# Patient Record
Sex: Female | Born: 1950
Health system: Southern US, Community
[De-identification: ages and names within clinical notes are randomized; demographics above are authoritative.]

## PROBLEM LIST (undated history)

## (undated) DIAGNOSIS — Z720 Tobacco use: Secondary | ICD-10-CM

## (undated) DIAGNOSIS — J42 Unspecified chronic bronchitis: Secondary | ICD-10-CM

## (undated) DIAGNOSIS — F329 Major depressive disorder, single episode, unspecified: Secondary | ICD-10-CM

## (undated) DIAGNOSIS — B1920 Unspecified viral hepatitis C without hepatic coma: Secondary | ICD-10-CM

## (undated) DIAGNOSIS — F1410 Cocaine abuse, uncomplicated: Secondary | ICD-10-CM

## (undated) DIAGNOSIS — R9431 Abnormal electrocardiogram [ECG] [EKG]: Secondary | ICD-10-CM

## (undated) DIAGNOSIS — J189 Pneumonia, unspecified organism: Secondary | ICD-10-CM

## (undated) DIAGNOSIS — E7800 Pure hypercholesterolemia, unspecified: Secondary | ICD-10-CM

## (undated) DIAGNOSIS — I10 Essential (primary) hypertension: Secondary | ICD-10-CM

## (undated) DIAGNOSIS — J8410 Pulmonary fibrosis, unspecified: Secondary | ICD-10-CM

## (undated) DIAGNOSIS — M069 Rheumatoid arthritis, unspecified: Secondary | ICD-10-CM

## (undated) DIAGNOSIS — F419 Anxiety disorder, unspecified: Secondary | ICD-10-CM

## (undated) DIAGNOSIS — K219 Gastro-esophageal reflux disease without esophagitis: Secondary | ICD-10-CM

## (undated) DIAGNOSIS — E119 Type 2 diabetes mellitus without complications: Secondary | ICD-10-CM

## (undated) DIAGNOSIS — R0600 Dyspnea, unspecified: Secondary | ICD-10-CM

## (undated) DIAGNOSIS — I739 Peripheral vascular disease, unspecified: Secondary | ICD-10-CM

## (undated) DIAGNOSIS — I509 Heart failure, unspecified: Secondary | ICD-10-CM

## (undated) DIAGNOSIS — F32A Depression, unspecified: Secondary | ICD-10-CM

## (undated) DIAGNOSIS — R0609 Other forms of dyspnea: Secondary | ICD-10-CM

## (undated) HISTORY — DX: Essential (primary) hypertension: I10

## (undated) HISTORY — DX: Abnormal electrocardiogram (ECG) (EKG): R94.31

## (undated) HISTORY — PX: DILATION AND CURETTAGE OF UTERUS: SHX78

## (undated) HISTORY — PX: TONSILLECTOMY: SUR1361

## (undated) HISTORY — DX: Depression, unspecified: F32.A

## (undated) HISTORY — DX: Major depressive disorder, single episode, unspecified: F32.9

## (undated) HISTORY — DX: Other forms of dyspnea: R06.09

## (undated) HISTORY — DX: Gastro-esophageal reflux disease without esophagitis: K21.9

## (undated) HISTORY — DX: Unspecified viral hepatitis C without hepatic coma: B19.20

## (undated) HISTORY — PX: TUBAL LIGATION: SHX77

## (undated) HISTORY — DX: Tobacco use: Z72.0

## (undated) HISTORY — DX: Dyspnea, unspecified: R06.00

---

## 1976-11-28 HISTORY — PX: OTHER SURGICAL HISTORY: SHX169

## 1980-11-28 HISTORY — PX: TOTAL ABDOMINAL HYSTERECTOMY: SHX209

## 1998-05-26 ENCOUNTER — Encounter: Admission: RE | Admit: 1998-05-26 | Discharge: 1998-05-26 | Payer: Self-pay | Admitting: Hematology and Oncology

## 2000-03-06 ENCOUNTER — Encounter: Admission: RE | Admit: 2000-03-06 | Discharge: 2000-03-06 | Payer: Self-pay | Admitting: Internal Medicine

## 2000-10-26 ENCOUNTER — Encounter: Admission: RE | Admit: 2000-10-26 | Discharge: 2000-10-26 | Payer: Self-pay | Admitting: Hematology and Oncology

## 2000-10-30 ENCOUNTER — Encounter: Admission: RE | Admit: 2000-10-30 | Discharge: 2000-10-30 | Payer: Self-pay | Admitting: Internal Medicine

## 2000-11-06 ENCOUNTER — Encounter: Admission: RE | Admit: 2000-11-06 | Discharge: 2000-11-06 | Payer: Self-pay | Admitting: Internal Medicine

## 2000-12-27 ENCOUNTER — Encounter: Admission: RE | Admit: 2000-12-27 | Discharge: 2000-12-27 | Payer: Self-pay | Admitting: Hematology and Oncology

## 2001-05-30 ENCOUNTER — Encounter: Admission: RE | Admit: 2001-05-30 | Discharge: 2001-05-30 | Payer: Self-pay | Admitting: Internal Medicine

## 2001-06-21 ENCOUNTER — Encounter: Admission: RE | Admit: 2001-06-21 | Discharge: 2001-06-21 | Payer: Self-pay | Admitting: Internal Medicine

## 2001-07-20 ENCOUNTER — Encounter: Admission: RE | Admit: 2001-07-20 | Discharge: 2001-07-20 | Payer: Self-pay | Admitting: Internal Medicine

## 2001-09-03 ENCOUNTER — Encounter: Admission: RE | Admit: 2001-09-03 | Discharge: 2001-09-03 | Payer: Self-pay

## 2001-09-06 ENCOUNTER — Ambulatory Visit (HOSPITAL_COMMUNITY): Admission: RE | Admit: 2001-09-06 | Discharge: 2001-09-06 | Payer: Self-pay | Admitting: *Deleted

## 2001-10-22 ENCOUNTER — Encounter: Admission: RE | Admit: 2001-10-22 | Discharge: 2001-10-22 | Payer: Self-pay | Admitting: Internal Medicine

## 2005-01-13 ENCOUNTER — Emergency Department (HOSPITAL_COMMUNITY): Admission: EM | Admit: 2005-01-13 | Discharge: 2005-01-13 | Payer: Self-pay | Admitting: *Deleted

## 2005-09-27 ENCOUNTER — Emergency Department (HOSPITAL_COMMUNITY): Admission: EM | Admit: 2005-09-27 | Discharge: 2005-09-27 | Payer: Self-pay | Admitting: Emergency Medicine

## 2006-05-22 ENCOUNTER — Ambulatory Visit: Payer: Self-pay | Admitting: Internal Medicine

## 2006-05-25 ENCOUNTER — Ambulatory Visit (HOSPITAL_COMMUNITY): Admission: RE | Admit: 2006-05-25 | Discharge: 2006-05-25 | Payer: Self-pay | Admitting: Family Medicine

## 2006-06-05 ENCOUNTER — Ambulatory Visit: Payer: Self-pay | Admitting: Internal Medicine

## 2007-05-22 ENCOUNTER — Ambulatory Visit: Payer: Self-pay | Admitting: Internal Medicine

## 2009-12-08 ENCOUNTER — Ambulatory Visit: Payer: Self-pay | Admitting: Diagnostic Radiology

## 2009-12-08 ENCOUNTER — Emergency Department (HOSPITAL_BASED_OUTPATIENT_CLINIC_OR_DEPARTMENT_OTHER): Admission: EM | Admit: 2009-12-08 | Discharge: 2009-12-08 | Payer: Self-pay | Admitting: Emergency Medicine

## 2010-12-19 ENCOUNTER — Encounter: Payer: Self-pay | Admitting: Internal Medicine

## 2010-12-20 ENCOUNTER — Encounter: Payer: Self-pay | Admitting: Internal Medicine

## 2011-02-13 LAB — BASIC METABOLIC PANEL
BUN: 5 mg/dL — ABNORMAL LOW (ref 6–23)
CO2: 31 mEq/L (ref 19–32)
Calcium: 9.7 mg/dL (ref 8.4–10.5)
Chloride: 103 mEq/L (ref 96–112)
Creatinine, Ser: 0.9 mg/dL (ref 0.4–1.2)
GFR calc Af Amer: >60 mL/min (ref >60)
GFR calc non Af Amer: >60 mL/min (ref >60)
Glucose, Bld: 163 mg/dL — ABNORMAL HIGH (ref 70–99)
Potassium: 4 mEq/L (ref 3.5–5.1)
Sodium: 143 mEq/L (ref 135–145)

## 2011-02-13 LAB — URINE MICROSCOPIC-ADD ON

## 2011-02-13 LAB — URINALYSIS, ROUTINE W REFLEX MICROSCOPIC
Glucose, UA: NEGATIVE mg/dL
Hgb urine dipstick: NEGATIVE
Ketones, ur: 15 mg/dL — AB
Nitrite: NEGATIVE
Protein, ur: 30 mg/dL — AB
Specific Gravity, Urine: 1.022 (ref 1.005–1.030)
Urobilinogen, UA: 1 mg/dL (ref 0.0–1.0)
pH: 6 (ref 5.0–8.0)

## 2011-02-13 LAB — DIFFERENTIAL
Basophils Absolute: 0 10*3/uL (ref 0.0–0.1)
Basophils Relative: 0 % (ref 0–1)
Eosinophils Absolute: 0 10*3/uL (ref 0.0–0.7)
Eosinophils Relative: 1 % (ref 0–5)
Lymphocytes Relative: 49 % — ABNORMAL HIGH (ref 12–46)
Lymphs Abs: 2.7 10*3/uL (ref 0.7–4.0)
Monocytes Absolute: 0.5 10*3/uL (ref 0.1–1.0)
Monocytes Relative: 10 % (ref 3–12)
Neutro Abs: 2.1 10*3/uL (ref 1.7–7.7)
Neutrophils Relative %: 40 % — ABNORMAL LOW (ref 43–77)

## 2011-02-13 LAB — CBC
HCT: 43.8 % (ref 36.0–46.0)
Hemoglobin: 14.7 g/dL (ref 12.0–15.0)
MCHC: 33.6 g/dL (ref 30.0–36.0)
MCV: 81.2 fL (ref 78.0–100.0)
Platelets: 272 10*3/uL (ref 150–400)
RBC: 5.4 MIL/uL — ABNORMAL HIGH (ref 3.87–5.11)
RDW: 13.5 % (ref 11.5–15.5)
WBC: 5.3 10*3/uL (ref 4.0–10.5)

## 2011-02-13 LAB — URINE CULTURE: Colony Count: 7000

## 2011-07-25 ENCOUNTER — Other Ambulatory Visit: Payer: Self-pay | Admitting: Internal Medicine

## 2011-07-25 DIAGNOSIS — Z1231 Encounter for screening mammogram for malignant neoplasm of breast: Secondary | ICD-10-CM

## 2011-08-05 ENCOUNTER — Ambulatory Visit
Admission: RE | Admit: 2011-08-05 | Discharge: 2011-08-05 | Disposition: A | Discharge status: Home / Self Care | Payer: Medicare Other | Source: Ambulatory Visit | Attending: Internal Medicine | Admitting: Internal Medicine

## 2011-08-05 DIAGNOSIS — Z1231 Encounter for screening mammogram for malignant neoplasm of breast: Secondary | ICD-10-CM

## 2011-08-10 ENCOUNTER — Other Ambulatory Visit: Payer: Self-pay | Admitting: Internal Medicine

## 2011-08-10 DIAGNOSIS — R928 Other abnormal and inconclusive findings on diagnostic imaging of breast: Secondary | ICD-10-CM

## 2011-08-15 ENCOUNTER — Ambulatory Visit
Admission: RE | Admit: 2011-08-15 | Discharge: 2011-08-15 | Disposition: A | Discharge status: Home / Self Care | Payer: Medicare Other | Source: Ambulatory Visit | Attending: Internal Medicine | Admitting: Internal Medicine

## 2011-08-15 ENCOUNTER — Other Ambulatory Visit: Payer: Self-pay | Admitting: Internal Medicine

## 2011-08-15 DIAGNOSIS — R928 Other abnormal and inconclusive findings on diagnostic imaging of breast: Secondary | ICD-10-CM

## 2012-01-23 ENCOUNTER — Encounter: Payer: Self-pay | Admitting: Pulmonary Disease

## 2012-01-24 ENCOUNTER — Ambulatory Visit (INDEPENDENT_AMBULATORY_CARE_PROVIDER_SITE_OTHER): Payer: Medicare Other | Admitting: Pulmonary Disease

## 2012-01-24 ENCOUNTER — Encounter: Payer: Self-pay | Admitting: Pulmonary Disease

## 2012-01-24 VITALS — BP 120/86 | HR 91 | Temp 98.0°F | Ht 62.0 in | Wt 131.6 lb

## 2012-01-24 DIAGNOSIS — R0989 Other specified symptoms and signs involving the circulatory and respiratory systems: Secondary | ICD-10-CM
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
## 2012-01-24 NOTE — Patient Instructions (Signed)
Will set up for breathing studies, and see you back same day to review Please bring disk of your recent ct chest with you to next visit. I will send a note to your primary doctor recommending a GI evaluation of your esophagus.  I am concerned you may be aspirating at night down into your lungs.   Work on total smoking cessation.

## 2012-02-03 ENCOUNTER — Ambulatory Visit (INDEPENDENT_AMBULATORY_CARE_PROVIDER_SITE_OTHER): Payer: Medicare Other | Admitting: Pulmonary Disease

## 2012-02-03 ENCOUNTER — Encounter: Payer: Self-pay | Admitting: Pulmonary Disease

## 2012-02-03 VITALS — BP 160/76 | HR 101 | Temp 98.2°F | Ht 62.0 in | Wt 133.0 lb

## 2012-02-03 DIAGNOSIS — R0989 Other specified symptoms and signs involving the circulatory and respiratory systems: Secondary | ICD-10-CM

## 2012-02-03 DIAGNOSIS — R0609 Other forms of dyspnea: Secondary | ICD-10-CM

## 2012-02-03 DIAGNOSIS — J9859 Other diseases of mediastinum, not elsewhere classified: Secondary | ICD-10-CM

## 2012-02-03 LAB — PULMONARY FUNCTION TEST

## 2012-02-03 NOTE — Progress Notes (Signed)
  Subjective:    Patient ID: Alexandria Coffey, female    DOB: 1951-07-28, 61 y.o.   MRN: [id number]  HPI The patient comes in today for followup after her pulmonary function studies.  She was found to have no air flow obstruction, moderate restriction, and a severe decrease in diffusion capacity.  Her DLCO corrected to normal with alveolar volume adjustment, and she did have very mild air trapping on lung volumes.  I have also had a chance to review her CT chest which shows significant interstitial disease in the right lung, but not in the left lung.  There were significant esophageal abnormalities noted as described in the CT report.  Finally, there was an ill-defined density in the anterior mediastinum that was very small and indistinct.  I have reviewed all of this with the patient in detail, and answered all of her questions.   Review of Systems  Constitutional: Negative for fever and unexpected weight change.  HENT: Negative for ear pain, nosebleeds, congestion, sore throat, rhinorrhea, sneezing, trouble swallowing, dental problem, postnasal drip and sinus pressure.   Eyes: Negative for redness and itching.  Respiratory: Negative for cough, chest tightness, shortness of breath and wheezing.   Cardiovascular: Negative for palpitations and leg swelling.  Gastrointestinal: Positive for nausea. Negative for vomiting.  Genitourinary: Negative for dysuria.  Musculoskeletal: Negative for joint swelling.  Skin: Negative for rash.  Neurological: Negative for headaches.  Hematological: Does not bruise/bleed easily.  Psychiatric/Behavioral: Negative for dysphoric mood. The patient is not nervous/anxious.        Objective:   Physical Exam Well-developed female in no acute distress Nose without purulence or discharge noted Chest with crackles throughout the right hemithorax, decreased breath sounds on the left Cardiac exam with regular rate and rhythm Lower extremities without significant edema, no  cyanosis Alert and oriented, moves all 4 extremities.       Assessment & Plan:
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
## 2012-02-03 NOTE — Patient Instructions (Signed)
Work on total smoking cessation Will schedule for sound wave test of heart to measure the pressure in the blood vessels in the lungs Please call your primary doctor's office Monday to see if they received my note recommending referral to a stomach specialist. You will need a repeat scan of your chest in 6mos to look at that "spot" in your chest.  Will call you to discuss test results, and arrange followup with me.

## 2012-02-03 NOTE — Assessment & Plan Note (Signed)
I have reviewed the patient's CT chest, and she clearly has significant interstitial changes on the right that are not present on the left.  This is unlikely to be related to an autoimmune process, and very likely to be secondary to chronic aspiration given her esophageal abnormalities.  Again, I would highly recommend a GI consultation.  Her PFTs today showed no airflow obstruction, but she does have significant restriction as expected with her diaphragm injury and interstitial process on the right.  Her diffusion capacity is also severely reduced, but corrects to normal with alveolar volume adjustment.  With her large pulmonary arteries on her CT scan, will check echocardiogram to rule out pulmonary hypertension.

## 2012-02-03 NOTE — Progress Notes (Signed)
PFT done today. 

## 2012-02-07 ENCOUNTER — Telehealth: Payer: Self-pay | Admitting: *Deleted

## 2012-02-07 DIAGNOSIS — T887XXA Unspecified adverse effect of drug or medicament, initial encounter: Secondary | ICD-10-CM

## 2012-02-07 NOTE — Telephone Encounter (Signed)
Message copied by Salli Quarry on Tue Feb 07, 2012 11:41 AM ------      Message from: Humberto Seals      Created: Tue Feb 07, 2012  9:28 AM       i need this pt a bmp in the computer  for 08/03/2012 for labs before ct with contrast on 08/08/12 thanks

## 2012-02-07 NOTE — Telephone Encounter (Signed)
Order in computer

## 2012-02-15 ENCOUNTER — Ambulatory Visit (HOSPITAL_COMMUNITY): Payer: Medicare Other | Attending: Cardiovascular Disease

## 2012-02-15 DIAGNOSIS — R0989 Other specified symptoms and signs involving the circulatory and respiratory systems: Secondary | ICD-10-CM | POA: Insufficient documentation

## 2012-02-15 DIAGNOSIS — E119 Type 2 diabetes mellitus without complications: Secondary | ICD-10-CM | POA: Insufficient documentation

## 2012-02-15 DIAGNOSIS — I10 Essential (primary) hypertension: Secondary | ICD-10-CM | POA: Insufficient documentation

## 2012-02-15 DIAGNOSIS — R0609 Other forms of dyspnea: Secondary | ICD-10-CM | POA: Insufficient documentation

## 2012-07-10 ENCOUNTER — Other Ambulatory Visit: Payer: Self-pay | Admitting: Internal Medicine

## 2012-07-10 DIAGNOSIS — Z1231 Encounter for screening mammogram for malignant neoplasm of breast: Secondary | ICD-10-CM

## 2012-08-08 ENCOUNTER — Other Ambulatory Visit (INDEPENDENT_AMBULATORY_CARE_PROVIDER_SITE_OTHER): Payer: Medicare Other

## 2012-08-08 ENCOUNTER — Other Ambulatory Visit: Payer: Medicare Other

## 2012-08-08 DIAGNOSIS — T887XXA Unspecified adverse effect of drug or medicament, initial encounter: Secondary | ICD-10-CM

## 2012-08-09 LAB — BASIC METABOLIC PANEL
BUN: 16 mg/dL (ref 6–23)
CO2: 27 mEq/L (ref 19–32)
Calcium: 9.3 mg/dL (ref 8.4–10.5)
Chloride: 104 mEq/L (ref 96–112)
Creatinine, Ser: 1 mg/dL (ref 0.4–1.2)
GFR: 76.9 mL/min (ref >60.00)
Glucose, Bld: 86 mg/dL (ref 70–99)
Potassium: 4.5 mEq/L (ref 3.5–5.1)
Sodium: 141 mEq/L (ref 135–145)

## 2012-08-10 ENCOUNTER — Ambulatory Visit (INDEPENDENT_AMBULATORY_CARE_PROVIDER_SITE_OTHER)
Admission: RE | Admit: 2012-08-10 | Discharge: 2012-08-10 | Disposition: A | Discharge status: Home / Self Care | Payer: Medicare Other | Source: Ambulatory Visit | Attending: Pulmonary Disease | Admitting: Pulmonary Disease

## 2012-08-10 DIAGNOSIS — R0989 Other specified symptoms and signs involving the circulatory and respiratory systems: Secondary | ICD-10-CM

## 2012-08-10 DIAGNOSIS — J9859 Other diseases of mediastinum, not elsewhere classified: Secondary | ICD-10-CM

## 2012-08-10 MED ORDER — IOHEXOL 300 MG/ML  SOLN
80.0000 mL | Freq: Once | INTRAMUSCULAR | Status: AC | PRN
  Administered 2012-08-10: 80 mL via INTRAVENOUS

## 2012-08-13 ENCOUNTER — Encounter: Payer: Self-pay | Admitting: Pulmonary Disease

## 2012-08-15 ENCOUNTER — Ambulatory Visit
Admission: RE | Admit: 2012-08-15 | Discharge: 2012-08-15 | Disposition: A | Discharge status: Home / Self Care | Payer: Medicare Other | Source: Ambulatory Visit | Attending: Internal Medicine | Admitting: Internal Medicine

## 2012-08-15 DIAGNOSIS — Z1231 Encounter for screening mammogram for malignant neoplasm of breast: Secondary | ICD-10-CM

## 2012-11-26 ENCOUNTER — Ambulatory Visit: Payer: Medicare Other | Admitting: Pulmonary Disease

## 2012-11-30 ENCOUNTER — Encounter: Payer: Self-pay | Admitting: Pulmonary Disease

## 2012-11-30 ENCOUNTER — Ambulatory Visit (INDEPENDENT_AMBULATORY_CARE_PROVIDER_SITE_OTHER): Payer: Medicare Other | Admitting: Pulmonary Disease

## 2012-11-30 VITALS — BP 150/92 | HR 88 | Temp 98.2°F | Ht 62.0 in | Wt 132.6 lb

## 2012-11-30 DIAGNOSIS — R0989 Other specified symptoms and signs involving the circulatory and respiratory systems: Secondary | ICD-10-CM
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
## 2012-11-30 NOTE — Progress Notes (Signed)
  Subjective:    Patient ID: Alexandria Coffey, female    DOB: March 14, 1951, 62 y.o.   MRN: [id number]  HPI Patient comes in today for followup of her known dyspnea on exertion, and also right-sided scarring and bronchiectasis probably related to aspiration.  She feels that her reflux is reasonably controlled, but does continue to have breakthrough at times.  I've asked her to stay in close contact with her gastroenterologist.  She also has minimal airflow obstruction on PFTs, and uses albuterol only as needed.  She denies any significant cough or mucus production.  She feels that her dyspnea on exertion currently is at baseline.   Review of Systems  Constitutional: Negative for fever and unexpected weight change.  HENT: Negative for ear pain, nosebleeds, congestion, sore throat, rhinorrhea, sneezing, trouble swallowing, dental problem, postnasal drip and sinus pressure.   Eyes: Negative for redness and itching.  Respiratory: Positive for cough ( dry cough at night), chest tightness, shortness of breath and wheezing.   Cardiovascular: Negative for palpitations and leg swelling.  Gastrointestinal: Negative for nausea and vomiting.  Genitourinary: Negative for dysuria.  Musculoskeletal: Negative for joint swelling.  Skin: Negative for rash.  Neurological: Negative for headaches.  Hematological: Does not bruise/bleed easily.  Psychiatric/Behavioral: Negative for dysphoric mood. The patient is not nervous/anxious.        Objective:   Physical Exam Well-developed female in no acute distress Nose without purulence or discharge noted Chest with crackles on the right, left clear Cardiac exam with regular rate and rhythm Lower extremities without edema, no cyanosis Alert and oriented, moves all 4 extremities.       Assessment & Plan:

## 2012-11-30 NOTE — Patient Instructions (Addendum)
Will give you the flu shot today. Ok to stay on albuterol just as needed. Make sure you keep close followup with your GI doctor, and to keep your reflux controlled. Work on Product manager followup with me as needed.

## 2013-05-09 ENCOUNTER — Other Ambulatory Visit: Payer: Self-pay | Admitting: Internal Medicine

## 2013-05-09 DIAGNOSIS — K219 Gastro-esophageal reflux disease without esophagitis: Secondary | ICD-10-CM

## 2013-05-22 ENCOUNTER — Other Ambulatory Visit: Payer: Self-pay | Admitting: Internal Medicine

## 2013-05-22 DIAGNOSIS — R1013 Epigastric pain: Secondary | ICD-10-CM

## 2013-05-27 ENCOUNTER — Ambulatory Visit
Admission: RE | Admit: 2013-05-27 | Discharge: 2013-05-27 | Disposition: A | Discharge status: Home / Self Care | Payer: Medicare Other | Source: Ambulatory Visit | Attending: Internal Medicine | Admitting: Internal Medicine

## 2013-05-27 DIAGNOSIS — R1013 Epigastric pain: Secondary | ICD-10-CM

## 2013-08-23 ENCOUNTER — Other Ambulatory Visit: Payer: Self-pay

## 2013-08-23 DIAGNOSIS — Z1231 Encounter for screening mammogram for malignant neoplasm of breast: Secondary | ICD-10-CM

## 2013-09-17 ENCOUNTER — Other Ambulatory Visit (HOSPITAL_COMMUNITY): Payer: Self-pay | Admitting: Internal Medicine

## 2013-09-17 ENCOUNTER — Ambulatory Visit (HOSPITAL_COMMUNITY)
Admission: RE | Admit: 2013-09-17 | Discharge: 2013-09-17 | Disposition: A | Discharge status: Home / Self Care | Payer: Medicare Other | Source: Ambulatory Visit | Attending: Internal Medicine | Admitting: Internal Medicine

## 2013-09-17 DIAGNOSIS — R059 Cough, unspecified: Secondary | ICD-10-CM | POA: Insufficient documentation

## 2013-09-17 DIAGNOSIS — J209 Acute bronchitis, unspecified: Secondary | ICD-10-CM

## 2013-09-17 DIAGNOSIS — R05 Cough: Secondary | ICD-10-CM | POA: Insufficient documentation

## 2013-09-17 DIAGNOSIS — J8410 Pulmonary fibrosis, unspecified: Secondary | ICD-10-CM | POA: Insufficient documentation

## 2013-09-20 ENCOUNTER — Ambulatory Visit
Admission: RE | Admit: 2013-09-20 | Discharge: 2013-09-20 | Disposition: A | Discharge status: Home / Self Care | Payer: Medicare Other | Source: Ambulatory Visit

## 2013-09-20 DIAGNOSIS — Z1231 Encounter for screening mammogram for malignant neoplasm of breast: Secondary | ICD-10-CM

## 2014-06-11 ENCOUNTER — Other Ambulatory Visit (HOSPITAL_COMMUNITY): Payer: Self-pay | Admitting: Internal Medicine

## 2014-06-11 ENCOUNTER — Ambulatory Visit (HOSPITAL_COMMUNITY): Payer: Medicare Other | Attending: Internal Medicine

## 2014-06-11 DIAGNOSIS — I739 Peripheral vascular disease, unspecified: Secondary | ICD-10-CM

## 2014-07-23 ENCOUNTER — Ambulatory Visit (HOSPITAL_COMMUNITY)
Admission: RE | Admit: 2014-07-23 | Discharge: 2014-07-23 | Disposition: A | Discharge status: Home / Self Care | Payer: Medicare Other | Source: Ambulatory Visit | Attending: Internal Medicine | Admitting: Internal Medicine

## 2014-07-23 ENCOUNTER — Other Ambulatory Visit (HOSPITAL_COMMUNITY): Payer: Self-pay | Admitting: Internal Medicine

## 2014-07-23 DIAGNOSIS — I70219 Atherosclerosis of native arteries of extremities with intermittent claudication, unspecified extremity: Secondary | ICD-10-CM

## 2014-07-23 DIAGNOSIS — I739 Peripheral vascular disease, unspecified: Secondary | ICD-10-CM | POA: Insufficient documentation

## 2014-07-23 NOTE — Progress Notes (Signed)
VASCULAR LAB PRELIMINARY  ARTERIAL  ABI completed:    RIGHT    LEFT    PRESSURE WAVEFORM  PRESSURE WAVEFORM  BRACHIAL 179 Biphasic BRACHIAL 141 Monophasic  DP 175 Biphasic DP 177 Biphasic  AT   AT    PT 153 Biphasic PT 141 Biphasic  PER   PER    GREAT TOE  NA GREAT TOE  NA    RIGHT LEFT  ABI 0.98 0.99   Post exercise the right brachial pressure was , the right ankle pressure was and the left ankle pressure was .   At rest bilateral ABIs are within normal limits. 46 seconds after exercise the right ABI is within normal limits, 1 minute and 29 seconds after exercise the left ABI is suggestive of mild arterial insufficiency.   There is a difference between the right and left brachial pressures, suggestive of a proximal obstruction.  07/23/2014 12:44 PM Gertie Fey, RVT, RDCS, RDMS

## 2014-10-13 DIAGNOSIS — E1151 Type 2 diabetes mellitus with diabetic peripheral angiopathy without gangrene: Secondary | ICD-10-CM

## 2014-10-14 ENCOUNTER — Ambulatory Visit (HOSPITAL_COMMUNITY)
Admission: RE | Admit: 2014-10-14 | Discharge: 2014-10-14 | Disposition: A | Discharge status: Home / Self Care | Payer: Medicare Other | Source: Ambulatory Visit | Attending: Cardiology | Admitting: Cardiology

## 2014-10-14 ENCOUNTER — Encounter (HOSPITAL_COMMUNITY)
Admission: RE | Disposition: A | Discharge status: Home / Self Care | Payer: Self-pay | Source: Ambulatory Visit | Attending: Cardiology

## 2014-10-14 ENCOUNTER — Encounter (HOSPITAL_COMMUNITY): Payer: Self-pay | Admitting: Cardiology

## 2014-10-14 DIAGNOSIS — I739 Peripheral vascular disease, unspecified: Secondary | ICD-10-CM

## 2014-10-14 DIAGNOSIS — E1151 Type 2 diabetes mellitus with diabetic peripheral angiopathy without gangrene: Secondary | ICD-10-CM

## 2014-10-14 DIAGNOSIS — I70212 Atherosclerosis of native arteries of extremities with intermittent claudication, left leg: Secondary | ICD-10-CM | POA: Insufficient documentation

## 2014-10-14 HISTORY — DX: Peripheral vascular disease, unspecified: I73.9

## 2014-10-14 HISTORY — PX: LOWER EXTREMITY ANGIOGRAM: SHX5508

## 2014-10-14 LAB — GLUCOSE, CAPILLARY: GLUCOSE-CAPILLARY: 177 mg/dL — AB (ref 70–99)

## 2014-10-14 LAB — CBC
HEMATOCRIT: 34.4 % — AB (ref 36.0–46.0)
HEMOGLOBIN: 11.3 g/dL — AB (ref 12.0–15.0)
MCH: 23.1 pg — AB (ref 26.0–34.0)
MCHC: 32.8 g/dL (ref 30.0–36.0)
MCV: 70.3 fL — AB (ref 78.0–100.0)
PLATELETS: 277 10*3/uL (ref 150–400)
RBC: 4.89 MIL/uL (ref 3.87–5.11)
RDW: 16.2 % — ABNORMAL HIGH (ref 11.5–15.5)
WBC: 5 10*3/uL (ref 4.0–10.5)

## 2014-10-14 LAB — POCT ACTIVATED CLOTTING TIME
ACTIVATED CLOTTING TIME: 259 s
Activated Clotting Time: 259 seconds

## 2014-10-14 SURGERY — ANGIOGRAM, LOWER EXTREMITY
Anesthesia: LOCAL

## 2014-10-14 MED ORDER — HYDRALAZINE HCL 25 MG PO TABS: 25.0000 mg | ORAL_TABLET | Freq: Three times a day (TID) | ORAL | Status: DC

## 2014-10-14 MED ORDER — HYDROMORPHONE HCL 1 MG/ML IJ SOLN
INTRAMUSCULAR | Status: AC
  Filled 2014-10-14: qty 1

## 2014-10-14 MED ORDER — METFORMIN HCL 1000 MG PO TABS: 1000.0000 mg | ORAL_TABLET | Freq: Two times a day (BID) | ORAL | Status: DC

## 2014-10-14 MED ORDER — NITROGLYCERIN 1 MG/10 ML FOR IR/CATH LAB
INTRA_ARTERIAL | Status: AC
  Filled 2014-10-14: qty 10

## 2014-10-14 MED ORDER — HYDRALAZINE HCL 20 MG/ML IJ SOLN
10.0000 mg | Freq: Once | INTRAMUSCULAR | Status: AC
Start: 2014-10-14 — End: 2014-10-14
  Administered 2014-10-14: 10 mg via INTRAVENOUS

## 2014-10-14 MED ORDER — HYDRALAZINE HCL 20 MG/ML IJ SOLN
INTRAMUSCULAR | Status: AC
  Filled 2014-10-14: qty 1

## 2014-10-14 MED ORDER — HEPARIN (PORCINE) IN NACL 2-0.9 UNIT/ML-% IJ SOLN
INTRAMUSCULAR | Status: AC
  Filled 2014-10-14: qty 1000

## 2014-10-14 MED ORDER — SODIUM CHLORIDE 0.9 % IV SOLN
INTRAVENOUS | Status: DC
  Administered 2014-10-14: 08:00:00 via INTRAVENOUS

## 2014-10-14 MED ORDER — SODIUM CHLORIDE 0.9 % IV SOLN: 1.0000 mL/kg/h | INTRAVENOUS | Status: DC

## 2014-10-14 MED ORDER — LIDOCAINE HCL (PF) 1 % IJ SOLN
INTRAMUSCULAR | Status: AC
  Filled 2014-10-14: qty 30

## 2014-10-14 MED ORDER — MIDAZOLAM HCL 2 MG/2ML IJ SOLN
INTRAMUSCULAR | Status: AC
  Filled 2014-10-14: qty 2

## 2014-10-14 MED ORDER — HEPARIN SODIUM (PORCINE) 1000 UNIT/ML IJ SOLN
INTRAMUSCULAR | Status: AC
  Filled 2014-10-14: qty 1

## 2014-10-14 NOTE — H&P (Signed)
  Please see office visit notes for complete details of HPI.  

## 2014-10-14 NOTE — Interval H&P Note (Signed)
History and Physical Interval Note:  10/14/2014 9:49 AM  Alexandria Coffey  has presented today for surgery, with the diagnosis of PVD  The various methods of treatment have been discussed with the patient and family. After consideration of risks, benefits and other options for treatment, the patient has consented to  Procedure(s): LOWER EXTREMITY ANGIOGRAM (N/A) and possible PTA as a surgical intervention .  The patient's history has been reviewed, patient examined, no change in status, stable for surgery.  I have reviewed the patient's chart and labs.  Questions were answered to the patient's satisfaction.     Pamella Pert

## 2014-10-14 NOTE — Progress Notes (Signed)
Report received from [facility]; Dr Jacinto Halim notified of B/P and order noted

## 2014-10-14 NOTE — Discharge Instructions (Signed)
Angiogram, Care After °Refer to this sheet in the next few weeks. These instructions provide you with information on caring for yourself after your procedure. Your health care provider may also give you more specific instructions. Your treatment has been planned according to current medical practices, but problems sometimes occur. Call your health care provider if you have any problems or questions after your procedure.  °WHAT TO EXPECT AFTER THE PROCEDURE °After your procedure, it is typical to have the following sensations: °· Minor discomfort or tenderness and a small bump at the catheter insertion site. The bump should usually decrease in size and tenderness within 1 to 2 weeks. °· Any bruising will usually fade within 2 to 4 weeks. °HOME CARE INSTRUCTIONS  °· You may need to keep taking blood thinners if they were prescribed for you. Take medicines only as directed by your health care provider. °· Do not apply powder or lotion to the site. °· Do not take baths, swim, or use a hot tub until your health care provider approves. °· You may shower 24 hours after the procedure. Remove the bandage (dressing) and gently wash the site with plain soap and water. Gently pat the site dry. °· Inspect the site at least twice daily. °· Limit your activity for the first 48 hours. Do not bend, squat, or lift anything over 20 lb (9 kg) or as directed by your health care provider. °· Plan to have someone take you home after the procedure. Follow instructions about when you can drive or return to work. °SEEK MEDICAL CARE IF: °· You get light-headed when standing up. °· You have drainage (other than a small amount of blood on the dressing). °· You have chills. °· You have a fever. °· You have redness, warmth, swelling, or pain at the insertion site. °SEEK IMMEDIATE MEDICAL CARE IF:  °· You develop chest pain or shortness of breath, feel faint, or pass out. °· You have bleeding, swelling larger than a walnut, or drainage from the  catheter insertion site. °· You develop pain, discoloration, coldness, or severe bruising in the leg or arm that held the catheter. °· You have heavy bleeding from the site. If this happens, hold pressure on the site and call 911. °MAKE SURE YOU: °· Understand these instructions. °· Will watch your condition. °· Will get help right away if you are not doing well or get worse. °Document Released: 06/02/2005 Document Revised: 03/31/2014 Document Reviewed: 04/08/2013 °[employer]® Patient Information ©2015 [employer]. This information is not intended to replace advice given to you by your health care provider. Make sure you discuss any questions you have with your health care provider. ° °

## 2014-10-14 NOTE — Op Note (Signed)
Procedures performed:  1. Right Femoral Arterial access 2. Abdominal aortogram. Abdominal aortogram with bifemoral runoff 3. Crossover from right to the left  femoral artery placement of catheter tip in the left  femoral artery.  4. left femoral arteriogram with distal runoff 5. PTA balloon angioplasty with a 6.0 x 40 mm chocolate balloon of the left proximal popliteal, mid SFA, proximal SFA, distal left external iliac artery 6. right femoral arteriogram and closure of the  femoral arterial access with  Perclose.  Indication: Claudication, lifestyle limiting,  Abnormal LE arterial duplex.   Peripheral arthrogram: No evidence of abdominal aneurysm. 2 renal arteries one on either side and they're widely patent.   Aortoiliac bifurcation was widely patent.left external iliac artery just before it crosses the humeral ligament, had a greater than 50% stenosis.  Femoral arteriogram: right femoral artery with distal runoff revealed a high-grade focal stenosis in the mid right SFA, followed by tandem less than 50% stenosis. Below the right knee, there is three-vessel runoff. The anterior tibial artery is a focal high-grade 90% stenosis.  Left femoral artery with distal runoff revealed left external iliac artery stenosis of greater than 50%, followed by a high-grade stenosis of 80%, tandem lesions in the proximal SFA just after its origin. The left SFA also had a high-grade 90% stenosis in the midsegment and distal SFA 90% stenosis followed by proximal popliteal artery of 90% stenosis. Below the left knee, there was two-vessel runoff in the form of peroneal and posterior tibial artery.  Interventional data: Sccessful PTA balloon angioplasty with a 6.0 x 40 mm chocolate balloon of the left proximal popliteal, mid SFA, proximal SFA, distal left external iliac artery. Brisk runoff was evident throughout the left lower extremity all the way to the below the knee vessels midway. Vessel runoff at the ankle was not  visualized after PTA.  Recommendation: Patient has a high-grade 90% stenosis in the right mid SFA which is very focal and is amenable for percutaneous angioplasty, the right anterior tibial artery has a high-grade 90-95% stenosis. I suspect this artery will probably completely close similar to the closure on the left anterior tibial artery. Hence I may consider balloon angioplasty of the same while performing right SFA angioplasty, however procedure will be determined depending on symptoms of the patient'.  TECHNIQUE OF THE  PROCEDURE: Under sterile precautions using a 5-French right femoral arterial access, a 5 French pigtail catheter was advanced  into the desscending aorta and arteriogram was performed. Then abdominal aortogram with bifemoral runoff was also performed.  Femoral arterial runoff: left femoral arterial runoff was performed using the same catheter after crossing over from the right  femoral artery into the left femoral artery with the help of a pigtail catheter. I then utilized a 5 [place] KMP catheter to guide me to access the femoral artery and SFA. The 5 French sheath on the right femoral artery was exchanged to a 45 cm 7 [place] Ansel sheath. The sheath was placed over the Versacore wire into the left common iliac artery. This was followed by intravenous heparin and maintaining ACT greater than 200 and then I utilized a 0.014" x 290 cm [place] core guidewire and the tip of the guidewire was placed into the peroneal artery. I then utilized the chocolate balloon and performed multiple angioplasty throughout the lesion sites, multiple inflations from 6 atmospheric pressures was performed for 1 minute to maximum of 2 minutes. The left external iliac artery stenosis was angioplastied at 10 atmospheric pressure for 90  seconds. Intra-arterial nitroglycerin was administered and angiography was performed. After visualization in multiple views, making sure there was no flow-limiting dissection, the wire  was withdrawn, the 7 [place] Ansel sheath was then pulled out of the body after performing right femoral arteriogram and Perclose device was utilized to close the access. Excellent hemostasis was obtained. Patient tolerated the procedure well. There was no immediate competitions. Patient will be discharged home with outpatient follow-up. A total of 170-180 mL of contrast was utilized for diagnostic and interventional procedure.

## 2014-11-06 ENCOUNTER — Encounter (HOSPITAL_COMMUNITY): Payer: Self-pay | Admitting: Cardiology

## 2014-11-29 ENCOUNTER — Emergency Department (HOSPITAL_BASED_OUTPATIENT_CLINIC_OR_DEPARTMENT_OTHER)
Admission: EM | Admit: 2014-11-29 | Discharge: 2014-11-29 | Disposition: A | Discharge status: Home / Self Care | Payer: Medicare Other | Attending: Emergency Medicine | Admitting: Emergency Medicine

## 2014-11-29 ENCOUNTER — Encounter (HOSPITAL_BASED_OUTPATIENT_CLINIC_OR_DEPARTMENT_OTHER): Payer: Self-pay | Admitting: *Deleted

## 2014-11-29 DIAGNOSIS — Z8739 Personal history of other diseases of the musculoskeletal system and connective tissue: Secondary | ICD-10-CM | POA: Insufficient documentation

## 2014-11-29 DIAGNOSIS — M542 Cervicalgia: Secondary | ICD-10-CM | POA: Diagnosis present

## 2014-11-29 DIAGNOSIS — Z7982 Long term (current) use of aspirin: Secondary | ICD-10-CM | POA: Diagnosis not present

## 2014-11-29 DIAGNOSIS — K219 Gastro-esophageal reflux disease without esophagitis: Secondary | ICD-10-CM | POA: Diagnosis not present

## 2014-11-29 DIAGNOSIS — S161XXA Strain of muscle, fascia and tendon at neck level, initial encounter: Secondary | ICD-10-CM

## 2014-11-29 DIAGNOSIS — E780 Pure hypercholesterolemia: Secondary | ICD-10-CM | POA: Insufficient documentation

## 2014-11-29 DIAGNOSIS — Y998 Other external cause status: Secondary | ICD-10-CM | POA: Diagnosis not present

## 2014-11-29 DIAGNOSIS — X58XXXA Exposure to other specified factors, initial encounter: Secondary | ICD-10-CM | POA: Diagnosis not present

## 2014-11-29 DIAGNOSIS — F329 Major depressive disorder, single episode, unspecified: Secondary | ICD-10-CM | POA: Insufficient documentation

## 2014-11-29 DIAGNOSIS — Z79899 Other long term (current) drug therapy: Secondary | ICD-10-CM | POA: Diagnosis not present

## 2014-11-29 DIAGNOSIS — Y9389 Activity, other specified: Secondary | ICD-10-CM | POA: Diagnosis not present

## 2014-11-29 DIAGNOSIS — I10 Essential (primary) hypertension: Secondary | ICD-10-CM | POA: Diagnosis not present

## 2014-11-29 DIAGNOSIS — Z720 Tobacco use: Secondary | ICD-10-CM | POA: Insufficient documentation

## 2014-11-29 DIAGNOSIS — E119 Type 2 diabetes mellitus without complications: Secondary | ICD-10-CM | POA: Insufficient documentation

## 2014-11-29 DIAGNOSIS — Y929 Unspecified place or not applicable: Secondary | ICD-10-CM | POA: Insufficient documentation

## 2014-11-29 DIAGNOSIS — R079 Chest pain, unspecified: Secondary | ICD-10-CM | POA: Diagnosis not present

## 2014-11-29 DIAGNOSIS — Z8619 Personal history of other infectious and parasitic diseases: Secondary | ICD-10-CM | POA: Diagnosis not present

## 2014-11-29 HISTORY — DX: Pure hypercholesterolemia, unspecified: E78.00

## 2014-11-29 LAB — TROPONIN I: Troponin I: <0.03 ng/mL (ref <0.031)

## 2014-11-29 MED ORDER — CYCLOBENZAPRINE HCL 10 MG PO TABS: 10.0000 mg | ORAL_TABLET | Freq: Three times a day (TID) | ORAL | Status: DC | PRN

## 2014-11-29 MED ORDER — HYDROCODONE-ACETAMINOPHEN 5-325 MG PO TABS: 2.0000 | ORAL_TABLET | Freq: Once | ORAL | Status: DC

## 2014-11-29 MED ORDER — OXYCODONE HCL 5 MG PO TABS: 5.0000 mg | ORAL_TABLET | Freq: Four times a day (QID) | ORAL | Status: DC | PRN

## 2014-11-29 MED ORDER — CYCLOBENZAPRINE HCL 10 MG PO TABS
10.0000 mg | ORAL_TABLET | Freq: Once | ORAL | Status: AC
  Administered 2014-11-29: 10 mg via ORAL
  Filled 2014-11-29: qty 1

## 2014-11-29 MED ORDER — OXYCODONE-ACETAMINOPHEN 5-325 MG PO TABS
2.0000 | ORAL_TABLET | Freq: Once | ORAL | Status: AC
  Administered 2014-11-29: 2 via ORAL
  Filled 2014-11-29: qty 2
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
## 2014-11-29 NOTE — ED Notes (Signed)
Patient c/o R side neck and R shoulder pain for the past few days,, she states that it feels as if she has a lump that is tender. Earlier in the week her L shoulder hurt also. No meds taken, no injury

## 2014-11-29 NOTE — Discharge Instructions (Signed)
Cervical Sprain A cervical sprain is an injury in the neck in which the strong, fibrous tissues (ligaments) that connect your neck bones stretch or tear. Cervical sprains can range from mild to severe. Severe cervical sprains can cause the neck vertebrae to be unstable. This can lead to damage of the spinal cord and can result in serious nervous system problems. The amount of time it takes for a cervical sprain to get better depends on the cause and extent of the injury. Most cervical sprains heal in 1 to 3 weeks. CAUSES  Severe cervical sprains may be caused by:   Contact sport injuries (such as from football, rugby, wrestling, hockey, auto racing, gymnastics, diving, martial arts, or boxing).   Motor vehicle collisions.   Whiplash injuries. This is an injury from a sudden forward and backward whipping movement of the head and neck.  Falls.  Mild cervical sprains may be caused by:   Being in an awkward position, such as while cradling a telephone between your ear and shoulder.   Sitting in a chair that does not offer proper support.   Working at a poorly Landscape [employer] station.   Looking up or down for long periods of time.  SYMPTOMS   Pain, soreness, stiffness, or a burning sensation in the front, back, or sides of the neck. This discomfort may develop immediately after the injury or slowly, 24 hours or more after the injury.   Pain or tenderness directly in the middle of the back of the neck.   Shoulder or upper back pain.   Limited ability to move the neck.   Headache.   Dizziness.   Weakness, numbness, or tingling in the hands or arms.   Muscle spasms.   Difficulty swallowing or chewing.   Tenderness and swelling of the neck.  DIAGNOSIS  Most of the time your health care provider can diagnose a cervical sprain by taking your history and doing a physical exam. Your health care provider will ask about previous neck injuries and any known neck  problems, such as arthritis in the neck. X-rays may be taken to find out if there are any other problems, such as with the bones of the neck. Other tests, such as a CT scan or MRI, may also be needed.  TREATMENT  Treatment depends on the severity of the cervical sprain. Mild sprains can be treated with rest, keeping the neck in place (immobilization), and pain medicines. Severe cervical sprains are immediately immobilized. Further treatment is done to help with pain, muscle spasms, and other symptoms and may include:  Medicines, such as pain relievers, numbing medicines, or muscle relaxants.   Physical therapy. This may involve stretching exercises, strengthening exercises, and posture training. Exercises and improved posture can help stabilize the neck, strengthen muscles, and help stop symptoms from returning.  HOME CARE INSTRUCTIONS   Put ice on the injured area.   Put ice in a plastic bag.   Place a towel between your skin and the bag.   Leave the ice on for 15-20 minutes, 3-4 times a day.   If your injury was severe, you may have been given a cervical collar to wear. A cervical collar is a two-piece collar designed to keep your neck from moving while it heals.  Do not remove the collar unless instructed by your health care provider.  If you have long hair, keep it outside of the collar.  Ask your health care provider before making any adjustments to your collar. Minor  adjustments may be required over time to improve comfort and reduce pressure on your chin or on the back of your head.  Ifyou are allowed to remove the collar for cleaning or bathing, follow your health care provider's instructions on how to do so safely.  Keep your collar clean by wiping it with mild soap and water and drying it completely. If the collar you have been given includes removable pads, remove them every 1-2 days and hand wash them with soap and water. Allow them to air dry. They should be completely  dry before you wear them in the collar.  If you are allowed to remove the collar for cleaning and bathing, wash and dry the skin of your neck. Check your skin for irritation or sores. If you see any, tell your health care provider.  Do not drive while wearing the collar.   Only take over-the-counter or prescription medicines for pain, discomfort, or fever as directed by your health care provider.   Keep all follow-up appointments as directed by your health care provider.   Keep all physical therapy appointments as directed by your health care provider.   Make any needed adjustments to your workstation to promote good posture.   Avoid positions and activities that make your symptoms worse.   Warm up and stretch before being active to help prevent problems.  SEEK MEDICAL CARE IF:   Your pain is not controlled with medicine.   You are unable to decrease your pain medicine over time as planned.   Your activity level is not improving as expected.  SEEK IMMEDIATE MEDICAL CARE IF:   You develop any bleeding.  You develop stomach upset.  You have signs of an allergic reaction to your medicine.   Your symptoms get worse.   You develop new, unexplained symptoms.   You have numbness, tingling, weakness, or paralysis in any part of your body.  MAKE SURE YOU:   Understand these instructions.  Will watch your condition.  Will get help right away if you are not doing well or get worse. Document Released: 09/11/2007 Document Revised: 11/19/2013 Document Reviewed: 05/22/2013 [facility] Patient Information 2015 [place], [place]. This information is not intended to replace advice given to you by your health care provider. Make sure you discuss any questions you have with your health care provider.    Muscle Cramps and Spasms Muscle cramps and spasms occur when a muscle or muscles tighten and you have no control over this tightening (involuntary muscle contraction). They are  a common problem and can develop in any muscle. The most common place is in the calf muscles of the leg. Both muscle cramps and muscle spasms are involuntary muscle contractions, but they also have differences:   Muscle cramps are sporadic and painful. They may last a few seconds to a quarter of an hour. Muscle cramps are often more forceful and last longer than muscle spasms.  Muscle spasms may or may not be painful. They may also last just a few seconds or much longer. CAUSES  It is uncommon for cramps or spasms to be due to a serious underlying problem. In many cases, the cause of cramps or spasms is unknown. Some common causes are:   Overexertion.   Overuse from repetitive motions (doing the same thing over and over).   Remaining in a certain position for a long period of time.   Improper preparation, form, or technique while performing a sport or activity.   Dehydration.   Injury.  Side effects of some medicines.   Abnormally low levels of the salts and ions in your blood (electrolytes), especially potassium and calcium. This could happen if you are taking water pills (diuretics) or you are pregnant.  Some underlying medical problems can make it more likely to develop cramps or spasms. These include, but are not limited to:   Diabetes.   Parkinson disease.   Hormone disorders, such as thyroid problems.   Alcohol abuse.   Diseases specific to muscles, joints, and bones.   Blood vessel disease where not enough blood is getting to the muscles.  HOME CARE INSTRUCTIONS   Stay well hydrated. Drink enough water and fluids to keep your urine clear or pale yellow.  It may be helpful to massage, stretch, and relax the affected muscle.  For tight or tense muscles, use a warm towel, heating pad, or hot shower water directed to the affected area.  If you are sore or have pain after a cramp or spasm, applying ice to the affected area may relieve discomfort.  Put  ice in a plastic bag.  Place a towel between your skin and the bag.  Leave the ice on for 15-20 minutes, 03-04 times a day.  Medicines used to treat a known cause of cramps or spasms may help reduce their frequency or severity. Only take over-the-counter or prescription medicines as directed by your caregiver. SEEK MEDICAL CARE IF:  Your cramps or spasms get more severe, more frequent, or do not improve over time.  MAKE SURE YOU:   Understand these instructions.  Will watch your condition.  Will get help right away if you are not doing well or get worse. Document Released: 05/06/2002 Document Revised: 03/11/2013 Document Reviewed: 10/31/2012 [facility] Patient Information 2015 [place], [place]. This information is not intended to replace advice given to you by your health care provider. Make sure you discuss any questions you have with your health care provider.

## 2014-12-09 ENCOUNTER — Other Ambulatory Visit: Payer: Self-pay

## 2014-12-09 DIAGNOSIS — Z1231 Encounter for screening mammogram for malignant neoplasm of breast: Secondary | ICD-10-CM

## 2014-12-23 ENCOUNTER — Ambulatory Visit: Payer: Medicare Other

## 2014-12-30 ENCOUNTER — Ambulatory Visit
Admission: RE | Admit: 2014-12-30 | Discharge: 2014-12-30 | Disposition: A | Discharge status: Home / Self Care | Payer: Medicare Other | Source: Ambulatory Visit

## 2014-12-30 ENCOUNTER — Encounter (INDEPENDENT_AMBULATORY_CARE_PROVIDER_SITE_OTHER): Payer: Self-pay

## 2014-12-30 ENCOUNTER — Ambulatory Visit: Payer: Medicare Other

## 2014-12-30 DIAGNOSIS — Z1231 Encounter for screening mammogram for malignant neoplasm of breast: Secondary | ICD-10-CM

## 2015-02-17 ENCOUNTER — Other Ambulatory Visit (HOSPITAL_COMMUNITY): Payer: Self-pay | Admitting: Internal Medicine

## 2015-02-17 DIAGNOSIS — I739 Peripheral vascular disease, unspecified: Secondary | ICD-10-CM

## 2015-02-24 ENCOUNTER — Ambulatory Visit (HOSPITAL_COMMUNITY)
Admission: RE | Admit: 2015-02-24 | Discharge: 2015-02-24 | Disposition: A | Discharge status: Home / Self Care | Payer: Medicare Other | Source: Ambulatory Visit | Attending: Internal Medicine | Admitting: Internal Medicine

## 2015-02-24 ENCOUNTER — Other Ambulatory Visit (HOSPITAL_COMMUNITY): Payer: Self-pay | Admitting: Internal Medicine

## 2015-02-24 DIAGNOSIS — M25512 Pain in left shoulder: Secondary | ICD-10-CM | POA: Diagnosis present

## 2015-02-24 DIAGNOSIS — Z1810 Retained metal fragments, unspecified: Secondary | ICD-10-CM | POA: Diagnosis not present

## 2015-03-04 ENCOUNTER — Ambulatory Visit (HOSPITAL_COMMUNITY)
Admission: RE | Admit: 2015-03-04 | Discharge: 2015-03-04 | Disposition: A | Discharge status: Home / Self Care | Payer: Medicare Other | Source: Ambulatory Visit | Attending: Internal Medicine | Admitting: Internal Medicine

## 2015-03-04 DIAGNOSIS — I739 Peripheral vascular disease, unspecified: Secondary | ICD-10-CM | POA: Diagnosis present

## 2015-03-04 DIAGNOSIS — R42 Dizziness and giddiness: Secondary | ICD-10-CM

## 2015-03-04 NOTE — Progress Notes (Signed)
*  PRELIMINARY RESULTS* Vascular Ultrasound Carotid Duplex (Doppler) has been completed.  Preliminary findings: Heterogeneous plaque carotid bifurcations bilaterally.  ICA velocities within the 1-39% range of stenosis bilaterally.   Vertebral arteries with antegrade flow bilaterally.  Loralie Champagne 03/04/2015, 10:02 AM
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
## 2015-05-25 MED ORDER — SODIUM CHLORIDE 0.9 % IV SOLN: INTRAVENOUS | Status: DC

## 2015-05-26 ENCOUNTER — Ambulatory Visit (HOSPITAL_COMMUNITY)
Admission: RE | Admit: 2015-05-26 | Discharge: 2015-05-26 | Disposition: A | Discharge status: Home / Self Care | Payer: Medicare Other | Source: Ambulatory Visit | Attending: Cardiology | Admitting: Cardiology

## 2015-05-26 ENCOUNTER — Encounter (HOSPITAL_COMMUNITY)
Admission: RE | Disposition: A | Discharge status: Home / Self Care | Payer: Self-pay | Source: Ambulatory Visit | Attending: Cardiology

## 2015-05-26 ENCOUNTER — Encounter (HOSPITAL_COMMUNITY): Payer: Self-pay | Admitting: Cardiology

## 2015-05-26 DIAGNOSIS — Z8719 Personal history of other diseases of the digestive system: Secondary | ICD-10-CM | POA: Diagnosis not present

## 2015-05-26 DIAGNOSIS — E1151 Type 2 diabetes mellitus with diabetic peripheral angiopathy without gangrene: Secondary | ICD-10-CM | POA: Insufficient documentation

## 2015-05-26 DIAGNOSIS — F329 Major depressive disorder, single episode, unspecified: Secondary | ICD-10-CM | POA: Insufficient documentation

## 2015-05-26 DIAGNOSIS — I10 Essential (primary) hypertension: Secondary | ICD-10-CM | POA: Insufficient documentation

## 2015-05-26 DIAGNOSIS — M810 Age-related osteoporosis without current pathological fracture: Secondary | ICD-10-CM | POA: Insufficient documentation

## 2015-05-26 DIAGNOSIS — I739 Peripheral vascular disease, unspecified: Secondary | ICD-10-CM | POA: Diagnosis present

## 2015-05-26 DIAGNOSIS — I70213 Atherosclerosis of native arteries of extremities with intermittent claudication, bilateral legs: Secondary | ICD-10-CM | POA: Diagnosis not present

## 2015-05-26 DIAGNOSIS — J8410 Pulmonary fibrosis, unspecified: Secondary | ICD-10-CM | POA: Diagnosis not present

## 2015-05-26 DIAGNOSIS — K219 Gastro-esophageal reflux disease without esophagitis: Secondary | ICD-10-CM | POA: Insufficient documentation

## 2015-05-26 DIAGNOSIS — Z87891 Personal history of nicotine dependence: Secondary | ICD-10-CM | POA: Diagnosis not present

## 2015-05-26 HISTORY — PX: PERIPHERAL VASCULAR CATHETERIZATION: SHX172C

## 2015-05-26 LAB — GLUCOSE, CAPILLARY
GLUCOSE-CAPILLARY: 81 mg/dL (ref 65–99)
GLUCOSE-CAPILLARY: 71 mg/dL (ref 65–99)
Glucose-Capillary: 105 mg/dL — ABNORMAL HIGH (ref 65–99)
Glucose-Capillary: 51 mg/dL — ABNORMAL LOW (ref 65–99)
Glucose-Capillary: 117 mg/dL — ABNORMAL HIGH (ref 65–99)

## 2015-05-26 SURGERY — LOWER EXTREMITY ANGIOGRAPHY
Anesthesia: LOCAL

## 2015-05-26 MED ORDER — MIDAZOLAM HCL 2 MG/2ML IJ SOLN
INTRAMUSCULAR | Status: AC
  Filled 2015-05-26: qty 2

## 2015-05-26 MED ORDER — LIDOCAINE HCL (PF) 1 % IJ SOLN
INTRAMUSCULAR | Status: AC
  Filled 2015-05-26: qty 30

## 2015-05-26 MED ORDER — HYDROMORPHONE HCL 1 MG/ML IJ SOLN
INTRAMUSCULAR | Status: AC
  Filled 2015-05-26: qty 1

## 2015-05-26 MED ORDER — HEPARIN (PORCINE) IN NACL 2-0.9 UNIT/ML-% IJ SOLN
INTRAMUSCULAR | Status: AC
  Filled 2015-05-26: qty 1000

## 2015-05-26 MED ORDER — MIDAZOLAM HCL 2 MG/2ML IJ SOLN
INTRAMUSCULAR | Status: DC | PRN
  Administered 2015-05-26: 2 mg via INTRAVENOUS

## 2015-05-26 MED ORDER — SODIUM CHLORIDE 0.9 % IV BOLUS (SEPSIS): 500.0000 mL | Freq: Once | INTRAVENOUS | Status: DC

## 2015-05-26 MED ORDER — HYDROMORPHONE HCL 1 MG/ML IJ SOLN
INTRAMUSCULAR | Status: DC | PRN
  Administered 2015-05-26 (×2): 0.5 mg via INTRAVENOUS

## 2015-05-26 MED ORDER — IODIXANOL 320 MG/ML IV SOLN
INTRAVENOUS | Status: DC | PRN
  Administered 2015-05-26: 111 mL via INTRAVENOUS

## 2015-05-26 MED ORDER — LIDOCAINE HCL (PF) 1 % IJ SOLN
INTRAMUSCULAR | Status: DC | PRN
  Administered 2015-05-26: 30 mL

## 2015-05-26 MED ORDER — SODIUM CHLORIDE 0.9 % IV SOLN: 1.0000 mL/kg/h | INTRAVENOUS | Status: DC

## 2015-05-26 MED ORDER — METFORMIN HCL 1000 MG PO TABS: 1000.0000 mg | ORAL_TABLET | Freq: Two times a day (BID) | ORAL | Status: DC

## 2015-05-26 SURGICAL SUPPLY — 11 items
CATH ANGIO 5F PIGTAIL 65CM (CATHETERS) ×3 IMPLANT
HAND CONTROLLER AVANTA (MISCELLANEOUS) ×3 IMPLANT
KIT PV (KITS) ×3 IMPLANT
SET AVANTA MULTI PATIENT (MISCELLANEOUS) IMPLANT
SET AVANTA SINGLE PATIENT (MISCELLANEOUS) ×2 IMPLANT
SHEATH AVANTA HAND CONTROLLER (MISCELLANEOUS) ×3 IMPLANT
SHEATH PINNACLE 5F 10CM (SHEATH) ×2 IMPLANT
SYR MEDRAD MARK V 150ML (SYRINGE) IMPLANT
TRANSDUCER W/STOPCOCK (MISCELLANEOUS) ×3 IMPLANT
TRAY PV CATH (CUSTOM PROCEDURE TRAY) ×3 IMPLANT
WIRE HITORQ VERSACORE ST 145CM (WIRE) ×3 IMPLANT

## 2015-05-26 NOTE — Progress Notes (Signed)
Site area: rt goin Site Prior to Removal:  Level  0 Pressure Applied For: 20 minutes Manual:   yes Patient Status During Pull:  stable Post Pull Site:  Level  0 Post Pull Instructions Given:  yes Post Pull Pulses Present: yes Dressing Applied:  tegaderm Bedrest begins @ 0915 Comments: drank 120cc OJ before removing sheath. Will recheck

## 2015-05-26 NOTE — Discharge Instructions (Signed)
°  HOLD METFORMIN FOR 48 HOURS.Angiogram An angiogram, also called angiography, is a procedure used to look at the blood vessels that carry blood to different parts of your body (arteries). In this procedure, dye is injected through a long, thin tube (catheter) into an artery. X-rays are then taken. The X-rays will show if there is a blockage or problem in a blood vessel.  LET [facility] CARE PROVIDER KNOW ABOUT:  Any allergies you have, including allergies to shellfish or contrast dye.   All medicines you are taking, including vitamins, herbs, eye drops, creams, and over-the-counter medicines.   Previous problems you or members of your family have had with the use of anesthetics.   Any blood disorders you have.   Previous surgeries you have had.  Any previous kidney problems or failure you have had.  Medical conditions you have.   Possibility of pregnancy, if this applies. RISKS AND COMPLICATIONS Generally, an angiogram is a safe procedure. However, as with any procedure, problems can occur. Possible problems include:  Injury to the blood vessels, including rupture or bleeding.  Infection or bruising at the catheter site.  Allergic reaction to the dye or contrast used.  Kidney damage from the dye or contrast used.  Blood clots that can lead to a stroke or heart attack. BEFORE THE PROCEDURE  Do not eat or drink after midnight on the night before the procedure, or as directed by your health care provider.   Ask your health care provider if you may drink enough water to take any needed medicines the morning of the procedure.  PROCEDURE  You may be given a medicine to help you relax (sedative) before and during the procedure. This medicine is given through an IV access tube that is inserted into one of your veins.   The area where the catheter will be inserted will be washed and shaved. This is usually done in the groin but may be done in the fold of your arm (near your  elbow) or in the wrist.  A medicine will be given to numb the area where the catheter will be inserted (local anesthetic).  The catheter will be inserted with a guide wire into an artery. The catheter is guided by using a type of X-ray (fluoroscopy) to the blood vessel being examined.   Dye is then injected into the catheter, and X-rays are taken. The dye helps to show where any narrowing or blockages are located.  AFTER THE PROCEDURE   If the procedure is done through the leg, you will be kept in bed lying flat for several hours. You will be instructed to not bend or cross your legs.  The insertion site will be checked frequently.  The pulse in your feet or wrist will be checked frequently.  Additional blood tests, X-rays, and electrocardiography may be done.   You may need to stay in the hospital overnight for observation.  Document Released: 08/24/2005 Document Revised: 11/19/2013 Document Reviewed: 04/17/2013 [facility] Patient Information 2015 [place], [place]. This information is not intended to replace advice given to you by your health care provider. Make sure you discuss any questions you have with your health care provider.

## 2015-05-26 NOTE — Progress Notes (Signed)
On room air now.

## 2015-05-26 NOTE — Interval H&P Note (Signed)
History and Physical Interval Note:  05/26/2015 7:46 AM  Alexandria Coffey  has presented today for surgery, with the diagnosis of pvd  The various methods of treatment have been discussed with the patient and family. After consideration of risks, benefits and other options for treatment, the patient has consented to  Procedure(s): Lower Extremity Angiography (N/A) and possible PTA as a surgical intervention .  The patient's history has been reviewed, patient examined, no change in status, stable for surgery.  I have reviewed the patient's chart and labs.  Questions were answered to the patient's satisfaction.   Procedure being observed by Ms. Shirlyn Goltz PA student, patient consents to this.   [name]

## 2015-05-27 MED FILL — Heparin Sodium (Porcine) 2 Unit/ML in Sodium Chloride 0.9%: INTRAMUSCULAR | Qty: 1000 | Status: AC
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
## 2015-06-29 MED ORDER — SODIUM CHLORIDE 0.9 % IV SOLN
INTRAVENOUS | Status: DC
  Administered 2015-06-30: 07:00:00 via INTRAVENOUS

## 2015-06-30 ENCOUNTER — Encounter (HOSPITAL_COMMUNITY): Payer: Self-pay | Admitting: General Practice

## 2015-06-30 ENCOUNTER — Observation Stay (HOSPITAL_COMMUNITY)
Admission: RE | Admit: 2015-06-30 | Discharge: 2015-07-01 | Disposition: A | Discharge status: Home / Self Care | Payer: Medicare Other | Source: Ambulatory Visit | Attending: Cardiology | Admitting: Cardiology

## 2015-06-30 ENCOUNTER — Encounter (HOSPITAL_COMMUNITY)
Admission: RE | Disposition: A | Discharge status: Home / Self Care | Payer: Medicare Other | Source: Ambulatory Visit | Attending: Cardiology

## 2015-06-30 DIAGNOSIS — E1151 Type 2 diabetes mellitus with diabetic peripheral angiopathy without gangrene: Secondary | ICD-10-CM | POA: Insufficient documentation

## 2015-06-30 DIAGNOSIS — I10 Essential (primary) hypertension: Secondary | ICD-10-CM | POA: Insufficient documentation

## 2015-06-30 DIAGNOSIS — I70212 Atherosclerosis of native arteries of extremities with intermittent claudication, left leg: Secondary | ICD-10-CM | POA: Diagnosis not present

## 2015-06-30 DIAGNOSIS — Z8719 Personal history of other diseases of the digestive system: Secondary | ICD-10-CM | POA: Insufficient documentation

## 2015-06-30 DIAGNOSIS — M810 Age-related osteoporosis without current pathological fracture: Secondary | ICD-10-CM | POA: Insufficient documentation

## 2015-06-30 DIAGNOSIS — J8410 Pulmonary fibrosis, unspecified: Secondary | ICD-10-CM | POA: Insufficient documentation

## 2015-06-30 DIAGNOSIS — F329 Major depressive disorder, single episode, unspecified: Secondary | ICD-10-CM | POA: Insufficient documentation

## 2015-06-30 DIAGNOSIS — I739 Peripheral vascular disease, unspecified: Secondary | ICD-10-CM | POA: Diagnosis present

## 2015-06-30 DIAGNOSIS — L7682 Other postprocedural complications of skin and subcutaneous tissue: Secondary | ICD-10-CM | POA: Diagnosis present

## 2015-06-30 DIAGNOSIS — I7092 Chronic total occlusion of artery of the extremities: Secondary | ICD-10-CM | POA: Insufficient documentation

## 2015-06-30 DIAGNOSIS — K219 Gastro-esophageal reflux disease without esophagitis: Secondary | ICD-10-CM | POA: Diagnosis not present

## 2015-06-30 HISTORY — PX: PERIPHERAL VASCULAR CATHETERIZATION: SHX172C

## 2015-06-30 HISTORY — DX: Type 2 diabetes mellitus without complications: E11.9

## 2015-06-30 HISTORY — PX: ILIAC ATHERECTOMY: SHX6328

## 2015-06-30 HISTORY — PX: TRANSLUMINAL ATHERECTOMY FEMORAL ARTERY: SHX2567

## 2015-06-30 LAB — GLUCOSE, CAPILLARY
GLUCOSE-CAPILLARY: 166 mg/dL — AB (ref 65–99)
GLUCOSE-CAPILLARY: 204 mg/dL — AB (ref 65–99)
Glucose-Capillary: 141 mg/dL — ABNORMAL HIGH (ref 65–99)
Glucose-Capillary: 169 mg/dL — ABNORMAL HIGH (ref 65–99)
Glucose-Capillary: 188 mg/dL — ABNORMAL HIGH (ref 65–99)

## 2015-06-30 LAB — POCT ACTIVATED CLOTTING TIME
ACTIVATED CLOTTING TIME: 276 s
ACTIVATED CLOTTING TIME: 245 s
Activated Clotting Time: 190 seconds
Activated Clotting Time: 171 seconds

## 2015-06-30 SURGERY — LOWER EXTREMITY ANGIOGRAPHY
Anesthesia: LOCAL

## 2015-06-30 MED ORDER — HYDROCOD POLST-CPM POLST ER 10-8 MG/5ML PO SUER
10.0000 mL | Freq: Once | ORAL | Status: DC
Start: 2015-06-30 — End: 2015-06-30

## 2015-06-30 MED ORDER — HEPARIN (PORCINE) IN NACL 2-0.9 UNIT/ML-% IJ SOLN
INTRAMUSCULAR | Status: DC | PRN
  Administered 2015-06-30: 20 mL

## 2015-06-30 MED ORDER — FAMOTIDINE 40 MG PO TABS
40.0000 mg | ORAL_TABLET | Freq: Two times a day (BID) | ORAL | Status: DC
  Administered 2015-06-30 – 2015-07-01 (×2): 40 mg via ORAL
  Filled 2015-06-30 (×3): qty 1

## 2015-06-30 MED ORDER — FAMOTIDINE IN NACL 20-0.9 MG/50ML-% IV SOLN
INTRAVENOUS | Status: DC | PRN
  Administered 2015-06-30: 20 mg via INTRAVENOUS

## 2015-06-30 MED ORDER — HEPARIN SODIUM (PORCINE) 1000 UNIT/ML IJ SOLN
INTRAMUSCULAR | Status: DC | PRN
  Administered 2015-06-30: 6000 [IU] via INTRAVENOUS
  Administered 2015-06-30: 2000 [IU] via INTRAVENOUS

## 2015-06-30 MED ORDER — AMLODIPINE BESYLATE 5 MG PO TABS
5.0000 mg | ORAL_TABLET | Freq: Every day | ORAL | Status: DC
  Administered 2015-06-30 – 2015-07-01 (×2): 5 mg via ORAL
  Filled 2015-06-30 (×2): qty 1

## 2015-06-30 MED ORDER — HYDRALAZINE HCL 20 MG/ML IJ SOLN
INTRAMUSCULAR | Status: AC
  Filled 2015-06-30: qty 1

## 2015-06-30 MED ORDER — OXYCODONE-ACETAMINOPHEN 5-325 MG PO TABS: 1.0000 | ORAL_TABLET | ORAL | Status: DC | PRN

## 2015-06-30 MED ORDER — CILOSTAZOL 100 MG PO TABS
50.0000 mg | ORAL_TABLET | Freq: Two times a day (BID) | ORAL | Status: DC
  Administered 2015-06-30 – 2015-07-01 (×2): 50 mg via ORAL
  Filled 2015-06-30 (×2): qty 1

## 2015-06-30 MED ORDER — CLOPIDOGREL BISULFATE 300 MG PO TABS
ORAL_TABLET | ORAL | Status: DC | PRN
  Administered 2015-06-30 (×2): 300 mg via ORAL

## 2015-06-30 MED ORDER — MIDAZOLAM HCL 2 MG/2ML IJ SOLN
INTRAMUSCULAR | Status: AC
  Filled 2015-06-30: qty 4

## 2015-06-30 MED ORDER — HYDROCOD POLST-CPM POLST ER 10-8 MG/5ML PO SUER
ORAL | Status: AC
  Filled 2015-06-30: qty 5

## 2015-06-30 MED ORDER — ASPIRIN 81 MG PO CHEW
81.0000 mg | CHEWABLE_TABLET | Freq: Every day | ORAL | Status: DC
  Administered 2015-06-30 – 2015-07-01 (×2): 81 mg via ORAL
  Filled 2015-06-30 (×2): qty 1

## 2015-06-30 MED ORDER — INSULIN ASPART 100 UNIT/ML ~~LOC~~ SOLN
0.0000 [IU] | Freq: Three times a day (TID) | SUBCUTANEOUS | Status: DC
  Administered 2015-07-01 (×2): 3 [IU] via SUBCUTANEOUS

## 2015-06-30 MED ORDER — LABETALOL HCL 5 MG/ML IV SOLN
10.0000 mg | Freq: Once | INTRAVENOUS | Status: AC
  Administered 2015-06-30: 10 mg via INTRAVENOUS

## 2015-06-30 MED ORDER — HYDROCOD POLST-CPM POLST ER 10-8 MG/5ML PO SUER
5.0000 mL | Freq: Two times a day (BID) | ORAL | Status: DC | PRN
  Administered 2015-06-30 – 2015-07-01 (×2): 5 mL via ORAL
  Filled 2015-06-30 (×4): qty 5

## 2015-06-30 MED ORDER — LABETALOL HCL 5 MG/ML IV SOLN
INTRAVENOUS | Status: AC
  Filled 2015-06-30: qty 4

## 2015-06-30 MED ORDER — HYDRALAZINE HCL 20 MG/ML IJ SOLN
INTRAMUSCULAR | Status: DC | PRN
  Administered 2015-06-30: 10 mg via INTRAVENOUS

## 2015-06-30 MED ORDER — NITROGLYCERIN 1 MG/10 ML FOR IR/CATH LAB: INTRA_ARTERIAL | Status: DC | PRN

## 2015-06-30 MED ORDER — NITROGLYCERIN 1 MG/10 ML FOR IR/CATH LAB
INTRA_ARTERIAL | Status: AC
  Filled 2015-06-30: qty 10

## 2015-06-30 MED ORDER — LIDOCAINE HCL (PF) 1 % IJ SOLN
INTRAMUSCULAR | Status: AC
  Filled 2015-06-30: qty 30

## 2015-06-30 MED ORDER — FENTANYL CITRATE (PF) 100 MCG/2ML IJ SOLN
INTRAMUSCULAR | Status: AC
  Filled 2015-06-30: qty 4

## 2015-06-30 MED ORDER — HYDROCOD POLST-CPM POLST ER 10-8 MG/5ML PO SUER: 5.0000 mL | ORAL | Status: DC | PRN

## 2015-06-30 MED ORDER — ENSURE ENLIVE PO LIQD
237.0000 mL | Freq: Two times a day (BID) | ORAL | Status: DC
  Administered 2015-06-30 – 2015-07-01 (×2): 237 mL via ORAL
  Filled 2015-06-30 (×5): qty 237

## 2015-06-30 MED ORDER — MIDAZOLAM HCL 2 MG/2ML IJ SOLN
INTRAMUSCULAR | Status: DC | PRN
  Administered 2015-06-30 (×2): 1 mg via INTRAVENOUS

## 2015-06-30 MED ORDER — HYDROCOD POLST-CPM POLST ER 10-8 MG/5ML PO SUER
5.0000 mL | Freq: Once | ORAL | Status: AC
  Administered 2015-06-30: 5 mL via ORAL

## 2015-06-30 MED ORDER — FENTANYL CITRATE (PF) 100 MCG/2ML IJ SOLN
INTRAMUSCULAR | Status: DC | PRN
  Administered 2015-06-30 (×2): 25 ug via INTRAVENOUS

## 2015-06-30 MED ORDER — HEPARIN (PORCINE) IN NACL 2-0.9 UNIT/ML-% IJ SOLN
INTRAMUSCULAR | Status: AC
  Filled 2015-06-30: qty 1000

## 2015-06-30 MED ORDER — ALBUTEROL SULFATE (2.5 MG/3ML) 0.083% IN NEBU: 2.5000 mg | INHALATION_SOLUTION | Freq: Four times a day (QID) | RESPIRATORY_TRACT | Status: DC | PRN

## 2015-06-30 MED ORDER — AMLODIPINE BESY-BENAZEPRIL HCL 5-20 MG PO CAPS: 1.0000 | ORAL_CAPSULE | Freq: Every day | ORAL | Status: DC

## 2015-06-30 MED ORDER — HYDRALAZINE HCL 20 MG/ML IJ SOLN: 10.0000 mg | INTRAMUSCULAR | Status: DC | PRN

## 2015-06-30 MED ORDER — BENAZEPRIL HCL 20 MG PO TABS
20.0000 mg | ORAL_TABLET | Freq: Every day | ORAL | Status: DC
  Administered 2015-06-30 – 2015-07-01 (×2): 20 mg via ORAL
  Filled 2015-06-30 (×2): qty 1

## 2015-06-30 MED ORDER — AMITRIPTYLINE HCL 25 MG PO TABS
50.0000 mg | ORAL_TABLET | Freq: Every day | ORAL | Status: DC
  Administered 2015-06-30: 22:00:00 50 mg via ORAL
  Filled 2015-06-30: qty 2

## 2015-06-30 MED ORDER — METFORMIN HCL 1000 MG PO TABS: 1000.0000 mg | ORAL_TABLET | Freq: Two times a day (BID) | ORAL | Status: DC

## 2015-06-30 MED ORDER — HEPARIN SODIUM (PORCINE) 1000 UNIT/ML IJ SOLN
INTRAMUSCULAR | Status: AC
  Filled 2015-06-30: qty 1

## 2015-06-30 MED ORDER — IODIXANOL 320 MG/ML IV SOLN
INTRAVENOUS | Status: DC | PRN
  Administered 2015-06-30: 90 mL via INTRA_ARTERIAL

## 2015-06-30 MED ORDER — GLIPIZIDE ER 10 MG PO TB24
10.0000 mg | ORAL_TABLET | Freq: Two times a day (BID) | ORAL | Status: DC
  Administered 2015-07-01: 09:00:00 10 mg via ORAL
  Filled 2015-06-30 (×3): qty 1

## 2015-06-30 SURGICAL SUPPLY — 15 items
BALLN LUTONIX DCB 5X60X130 (BALLOONS) ×3
BALLN LUTONIX DCB 6X40X130 (BALLOONS) ×3
BALLOON LUTONIX DCB 5X60X130 (BALLOONS) ×2 IMPLANT
BALLOON LUTONIX DCB 6X40X130 (BALLOONS) ×2 IMPLANT
CATH CROSS OVER TEMPO 5F (CATHETERS) ×3 IMPLANT
CATH HAWKONE LX EXTENDED TIP (CATHETERS) ×1 IMPLANT
DEVICE SPIDERFX EMB PROT 6MM (WIRE) ×3 IMPLANT
GUIDEWIRE ANGLED .035X150CM (WIRE) ×3 IMPLANT
KIT ENCORE 26 ADVANTAGE (KITS) ×3 IMPLANT
KIT PV (KITS) ×3 IMPLANT
SHEATH FLEXOR ANSEL 1 7F 45CM (SHEATH) ×3 IMPLANT
TRANSDUCER W/STOPCOCK (MISCELLANEOUS) ×3 IMPLANT
TRAY PV CATH (CUSTOM PROCEDURE TRAY) ×3 IMPLANT
WIRE HI TORQ VERSACORE J 260CM (WIRE) ×3 IMPLANT
WIRE SPARTACORE .014X300CM (WIRE) ×3 IMPLANT

## 2015-06-30 NOTE — Progress Notes (Signed)
Pt arrived on room air, spo2= 90%

## 2015-06-30 NOTE — Progress Notes (Signed)
Site area: RFA Site Prior to Removal:  Level 0 Pressure Applied For:20 min Manual: yes   Patient Status During Pull: stable  Post Pull Site:  Level 0 Post Pull Instructions Given:  yes Post Pull Pulses Present: palpable Dressing Applied:  clear Bedrest begins @ 1120 till 1520 Comments: removed by Jodete/canderson

## 2015-06-30 NOTE — Progress Notes (Signed)
Right groin bleeding noted after coughing and pressure held for 15 min and no further bleeding noted and Dr  Jacinto Halim notified and orders noted

## 2015-07-01 ENCOUNTER — Encounter (HOSPITAL_COMMUNITY): Payer: Self-pay | Admitting: Cardiology

## 2015-07-01 DIAGNOSIS — I70212 Atherosclerosis of native arteries of extremities with intermittent claudication, left leg: Secondary | ICD-10-CM | POA: Diagnosis not present

## 2015-07-01 LAB — GLUCOSE, CAPILLARY
GLUCOSE-CAPILLARY: 192 mg/dL — AB (ref 65–99)
Glucose-Capillary: 155 mg/dL — ABNORMAL HIGH (ref 65–99)

## 2015-07-01 MED ORDER — ANGIOPLASTY BOOK
Freq: Once | Status: DC
  Filled 2015-07-01: qty 1

## 2015-07-01 MED ORDER — LIVING WELL WITH DIABETES BOOK
Freq: Once | Status: DC
  Filled 2015-07-01: qty 1

## 2015-07-01 MED FILL — Nitroglycerin IV Soln 100 MCG/ML in D5W: INTRA_ARTERIAL | Qty: 10 | Status: AC

## 2015-07-01 NOTE — Discharge Instructions (Signed)
Arteriogram  An arteriogram (or angiogram) is an X-ray test of your blood vessels. You will be awake during the test. This test looks for:  Blocked blood vessels.  Blood vessels that are not normal. BEFORE THE TEST Schedule an appointment for your arteriogram. Let the person know if:  You have diabetes.  You are allergic to any food or medicine.  You are allergic to X-ray dye (contrast).  You have asthma or had it when you were a child.  You are pregnant or you might be pregnant.  You are taking aspirin or blood thinners. The night before the test:   Do not  eat or drink anything after midnight. On the morning of your test:   Do not drive. Have someone bring you and take you home.  Bring all the medicines you need to take with you. If you have diabetes, do not take your insulin before the test, but bring it with you. THE TEST  You will lie on the X-ray table.  An IV tube is started in your arm.  Your blood pressure and heartbeat are checked during the test.  The upper part of your leg (groin) is shaved and washed with special soap.  You are then covered with a germ free (sterile) sheet. Keep your arms at your side under the sheet at all times so that germs do not get on the sheet.  The skin is numbed where the thin tube (catheter) is put into your upper leg. The thin tube is moved up into the blood vessels that your doctor wants to see.  Dye is put in through the tube. You may have a feeling of heat as the dye moves into your body.  X-ray pictures of your blood vessels are taken. Do not move while the dye goes in and pictures are taken. AFTER THE TEST  The thin tube will be taken out of your upper leg.  The nurse will check:  Your blood pressure.  The place where the thin tube was taken out to make sure it is not bleeding.  The blood flow to your feet.  Lie flat in bed. Keep your leg straight for 6 hours after the thin tube is taken out.  Ask your doctor or  nurse if you should take your regular medications that you brought. Finding out the results of your test Ask when your test results will be ready. Make sure you get your test results. Document Released: 02/10/2009 Document Revised: 11/19/2013 Document Reviewed: 02/10/2009 [facility] Patient Information 2015 [place], [place]. This information is not intended to replace advice given to you by your health care provider. Make sure you discuss any questions you have with your health care provider.  Ankle-Brachial Index Test The Ankle-Brachial index is a test used to find peripheral vascular disease (PVD). PVD is also known as peripheral arterial disease (PAD). PVD is a blocking or hardening of the arteries anywhere within the circulatory system beyond the heart. The cause is cholesterol deposits within the blood vessels (atherosclerosis). These deposits cause arteries to narrow. The delivery of oxygen to tissues is impaired as a result. This can cause muscle pain and fatigue. This is called claudication.  PVD means there may also be build up of cholesterol in the:  Heart, increasing the risk for heart attacks.  Brain, increasing the risk for strokes. This test measures the blood flow in the arms and legs. The test also determines if blood vessels are clogged by cholesterol deposits.  HOW IS  THE ANKLE-BRACHIAL INDEX TEST DONE? The test is done while you are lying down and resting. The arm (brachial) and ankle systolic pressure are measured. The measurements are taken two times on both sides. Systolic pressure is the pressure within the arteries when the heart pumps. The highest systolic pressure of the ankle is then divided by the highest arm systolic pressure. The result is the ankle-brachial pressure ratio or ABI. There should be a difference of less than 10 mm Hg. Sometimes this test is repeated after five minutes of exercising on a treadmill.  You may have leg pain during the treadmill portion of the test  if you suffer from PAD. If the index number drops after exercise, this may show that PAD is present. Document Released: 11/18/2004 Document Revised: 11/19/2013 Document Reviewed: 01/09/2008 [facility] Patient Information 2015 [place], [place]. This information is not intended to replace advice given to you by your health care provider. Make sure you discuss any questions you have with your health care provider.

## 2015-07-01 NOTE — Discharge Summary (Signed)
Physician Discharge Summary  Patient ID: Alexandria Coffey MRN: [id number] DOB/AGE: 12-10-1950 64 y.o.  Admit date: 06/30/2015 Discharge date: 07/01/2015  Primary Discharge Diagnosis: PAD  Secondary Discharge Diagnosis: Type 2 Diabetes Mellitus, HTN  Significant Diagnostic Studies:  Peripheral Vascular Angiogram: Successful directional atherectomy with 3.5-7 mm HawkOne atherectomy system, followed by drug-coated balloon angioplasty of the left external iliac artery with a 6.0 x 40 mm and left proximal SFA with a 5.0 x 60 mm Lutonix balloon. Stenosis reduced from 99% in the left maximal SFA to 0% and left external iliac artery from 80% to 0%, maintenance of two-vessel runoff below the knee at the end of the procedure. 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  Hospital Course: Patient has known peripheral arterial disease, history of balloon angioplasty of the left external iliac and left common femoral artery in the past, left SFA stenting, who had recurrence of claudication. She had undergone diagnostic angiography on 05/26/2015, which had revealed restenosis of the left external iliac balloon angioplasty site and left proximal SFA. Due to symptomatic claudication, patient was scheduled for elective PV angio and atherectomy.  She underwent successful directional atherectomy with 3.5-7 mm HawkOne atherectomy system, followed by drug-coated balloon angioplasty of the left external iliac artery with a 6.0 x 40 mm and left proximal SFA with a 5.0 x 60 mm Lutonix balloon. Stenosis reduced from 99% in the left maximal SFA to 0% and left external iliac artery from 80% to 0%, maintenance of two-vessel runoff below the knee at the end of the procedure. 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  She felt she could not go home last night as she lives alone and was kept overnight for monitoring and assistance with ambulation.   She had minimal bleeding from access site last night which has resolved.  No bleeding or hematoma noted at access site presently.    Recommendations on discharge: Patient is on appropriate medical therapy. No medication changes were made.  She will be scheduled for out-patient ABI post procedure in the next couple weeks and then f/u with me.  She has a residual high-grade 90-95% stenosis of the right mid SFA which will need elective angioplasty, to be scheduled on an elective fashion.  Discharge Exam: Blood pressure 90/53, pulse 80, temperature 97.8 F (36.6 C), temperature source Oral, resp. rate 16, height 5\' 2"  (1.575 m), weight 54.7 kg (120 lb 9.5 oz), SpO2 96 %.    General appearance: alert, cooperative, appears stated age and no distress Resp: clear to auscultation bilaterally Cardio: S1, S2 normal, no S3 or S4 and systolic murmur: early systolic 1/6,   at 2nd right intercostal space Extremities: extremities normal, atraumatic, no cyanosis or edema Pulses: Right Pulses: FEM: present 2+, POP: present 1+, DP: present 2+, PT: absent Left Pulses: FEM: present 2+, POP: present 1+, DP: present 2+, PT: absent Skin: Skin color, texture, turgor normal. No rashes or lesions  Labs:   Lab Results  Component Value Date   WBC 5.0 10/14/2014   HGB 11.3* 10/14/2014   HCT 34.4* 10/14/2014   MCV 70.3* 10/14/2014   PLT 277 10/14/2014   No results for input(s): NA, K, CL, CO2, BUN, CREATININE, CALCIUM, PROT, BILITOT, ALKPHOS, ALT, AST, GLUCOSE in the last 168 hours.  Invalid input(s): LABALBU  Lipid Panel  No results found for: CHOL, TRIG, HDL, CHOLHDL, VLDL, LDLCALC  BNP (last 3 results) No results for  input(s): BNP in the last 8760 hours.  ProBNP (last 3 results) No results for input(s): PROBNP in the last 8760 hours.  HEMOGLOBIN A1C No results found for: HGBA1C, MPG  Cardiac Panel (last 3 results)  Recent Labs  11/29/14 1025  TROPONINI <0.03    Lab Results  Component Value Date    TROPONINI <0.03 11/29/2014     TSH No results for input(s): TSH in the last 8760 hours.  Radiology: No results found.    FOLLOW UP PLANS AND APPOINTMENTS Discharge Instructions    Discharge patient    Complete by:  As directed             Medication List    TAKE these medications        albuterol 108 (90 BASE) MCG/ACT inhaler  Commonly known as:  PROVENTIL HFA;VENTOLIN HFA  Inhale 2 puffs into the lungs every 6 (six) hours as needed for wheezing or shortness of breath.     amitriptyline 50 MG tablet  Commonly known as:  ELAVIL  Take 50 mg by mouth at bedtime.     amLODipine-benazepril 5-20 MG per capsule  Commonly known as:  LOTREL  Take 1 capsule by mouth daily.     aspirin 81 MG chewable tablet  Chew 81 mg by mouth daily.     cilostazol 50 MG tablet  Commonly known as:  PLETAL  Take 50 mg by mouth 2 (two) times daily.     glipiZIDE 10 MG 24 hr tablet  Commonly known as:  GLUCOTROL XL  Take 10 mg by mouth 2 (two) times daily.     metFORMIN 1000 MG tablet  Commonly known as:  GLUCOPHAGE  Take 1 tablet (1,000 mg total) by mouth 2 (two) times daily.  Start taking on:  07/02/2015     ranitidine 150 MG tablet  Commonly known as:  ZANTAC  Take 150 mg by mouth 2 (two) times daily.           Follow-up Information    Follow up with [name], MD.   Specialty:  Cardiology   Why:  You will be scheduled for ABI post procedure and keep the previous appointment with me.   Contact information:   [address] [place] [address] [phone]        Erling Conte, NP-C 07/01/2015, 9:20 AM [facility] Pager: [phone] Office: [phone]
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
## 2016-03-29 ENCOUNTER — Encounter (HOSPITAL_COMMUNITY): Payer: Self-pay | Admitting: General Practice

## 2016-03-29 ENCOUNTER — Encounter (HOSPITAL_COMMUNITY)
Admission: RE | Disposition: A | Discharge status: Home / Self Care | Payer: Self-pay | Source: Ambulatory Visit | Attending: Cardiology

## 2016-03-29 ENCOUNTER — Observation Stay (HOSPITAL_COMMUNITY)
Admission: RE | Admit: 2016-03-29 | Discharge: 2016-03-30 | Disposition: A | Discharge status: Home / Self Care | Payer: Medicare Other | Source: Ambulatory Visit | Attending: Cardiology | Admitting: Cardiology

## 2016-03-29 DIAGNOSIS — I739 Peripheral vascular disease, unspecified: Secondary | ICD-10-CM

## 2016-03-29 DIAGNOSIS — E1151 Type 2 diabetes mellitus with diabetic peripheral angiopathy without gangrene: Secondary | ICD-10-CM | POA: Diagnosis not present

## 2016-03-29 DIAGNOSIS — Z87891 Personal history of nicotine dependence: Secondary | ICD-10-CM | POA: Diagnosis not present

## 2016-03-29 DIAGNOSIS — E785 Hyperlipidemia, unspecified: Secondary | ICD-10-CM | POA: Diagnosis not present

## 2016-03-29 DIAGNOSIS — Z7984 Long term (current) use of oral hypoglycemic drugs: Secondary | ICD-10-CM | POA: Insufficient documentation

## 2016-03-29 DIAGNOSIS — M810 Age-related osteoporosis without current pathological fracture: Secondary | ICD-10-CM | POA: Diagnosis not present

## 2016-03-29 DIAGNOSIS — Z9862 Peripheral vascular angioplasty status: Secondary | ICD-10-CM | POA: Diagnosis not present

## 2016-03-29 DIAGNOSIS — I10 Essential (primary) hypertension: Secondary | ICD-10-CM | POA: Diagnosis not present

## 2016-03-29 DIAGNOSIS — E119 Type 2 diabetes mellitus without complications: Secondary | ICD-10-CM | POA: Diagnosis not present

## 2016-03-29 DIAGNOSIS — I70211 Atherosclerosis of native arteries of extremities with intermittent claudication, right leg: Secondary | ICD-10-CM | POA: Diagnosis present

## 2016-03-29 HISTORY — PX: TRANSLUMINAL ATHERECTOMY FEMORAL ARTERY: SHX2567

## 2016-03-29 HISTORY — DX: Anxiety disorder, unspecified: F41.9

## 2016-03-29 HISTORY — PX: PERIPHERAL VASCULAR CATHETERIZATION: SHX172C

## 2016-03-29 HISTORY — DX: Unspecified chronic bronchitis: J42

## 2016-03-29 LAB — GLUCOSE, CAPILLARY
GLUCOSE-CAPILLARY: 146 mg/dL — AB (ref 65–99)
GLUCOSE-CAPILLARY: 51 mg/dL — AB (ref 65–99)
GLUCOSE-CAPILLARY: 254 mg/dL — AB (ref 65–99)
Glucose-Capillary: 162 mg/dL — ABNORMAL HIGH (ref 65–99)
Glucose-Capillary: 167 mg/dL — ABNORMAL HIGH (ref 65–99)
Glucose-Capillary: 245 mg/dL — ABNORMAL HIGH (ref 65–99)
Glucose-Capillary: 142 mg/dL — ABNORMAL HIGH (ref 65–99)

## 2016-03-29 LAB — POCT ACTIVATED CLOTTING TIME
ACTIVATED CLOTTING TIME: 250 s
ACTIVATED CLOTTING TIME: 250 s
ACTIVATED CLOTTING TIME: 214 s
Activated Clotting Time: 183 seconds

## 2016-03-29 SURGERY — LOWER EXTREMITY ANGIOGRAPHY
Anesthesia: LOCAL

## 2016-03-29 MED ORDER — SODIUM CHLORIDE 0.9 % IV SOLN
INTRAVENOUS | Status: DC
  Administered 2016-03-29: 09:00:00 via INTRAVENOUS

## 2016-03-29 MED ORDER — NITROGLYCERIN 1 MG/10 ML FOR IR/CATH LAB
INTRA_ARTERIAL | Status: DC | PRN
  Administered 2016-03-29: 500 ug via INTRA_ARTERIAL
  Administered 2016-03-29: 200 ug via INTRA_ARTERIAL

## 2016-03-29 MED ORDER — DIAZEPAM 5 MG PO TABS
ORAL_TABLET | ORAL | Status: AC
  Filled 2016-03-29: qty 1

## 2016-03-29 MED ORDER — LIDOCAINE HCL (PF) 1 % IJ SOLN
INTRAMUSCULAR | Status: AC
  Filled 2016-03-29: qty 30

## 2016-03-29 MED ORDER — LIDOCAINE-EPINEPHRINE 1 %-1:100000 IJ SOLN
INTRAMUSCULAR | Status: AC
  Filled 2016-03-29: qty 1

## 2016-03-29 MED ORDER — METFORMIN HCL 1000 MG PO TABS: 1000.0000 mg | ORAL_TABLET | Freq: Two times a day (BID) | ORAL | Status: DC

## 2016-03-29 MED ORDER — LABETALOL HCL 5 MG/ML IV SOLN
INTRAVENOUS | Status: DC | PRN
  Administered 2016-03-29: 15 mg via INTRAVENOUS

## 2016-03-29 MED ORDER — SODIUM CHLORIDE 0.9 % IV SOLN
1.0000 mL/kg/h | INTRAVENOUS | Status: DC
  Administered 2016-03-29: 1 mL/kg/h via INTRAVENOUS

## 2016-03-29 MED ORDER — LABETALOL HCL 5 MG/ML IV SOLN
INTRAVENOUS | Status: AC
  Filled 2016-03-29: qty 4

## 2016-03-29 MED ORDER — SODIUM CHLORIDE 0.9 % IV SOLN: 250.0000 mL | INTRAVENOUS | Status: DC | PRN

## 2016-03-29 MED ORDER — SODIUM CHLORIDE 0.9% FLUSH: 3.0000 mL | INTRAVENOUS | Status: DC | PRN

## 2016-03-29 MED ORDER — SODIUM CHLORIDE 0.9% FLUSH
3.0000 mL | Freq: Two times a day (BID) | INTRAVENOUS | Status: DC
  Administered 2016-03-29: 22:00:00 3 mL via INTRAVENOUS

## 2016-03-29 MED ORDER — INSULIN ASPART 100 UNIT/ML ~~LOC~~ SOLN
0.0000 [IU] | Freq: Three times a day (TID) | SUBCUTANEOUS | Status: DC
Start: 2016-03-29 — End: 2016-03-30
  Administered 2016-03-29: 3 [IU] via SUBCUTANEOUS
  Administered 2016-03-29: 5 [IU] via SUBCUTANEOUS
  Administered 2016-03-30: 3 [IU] via SUBCUTANEOUS

## 2016-03-29 MED ORDER — INSULIN ASPART 100 UNIT/ML ~~LOC~~ SOLN
8.0000 [IU] | Freq: Once | SUBCUTANEOUS | Status: AC
  Administered 2016-03-29: 8 [IU] via SUBCUTANEOUS

## 2016-03-29 MED ORDER — ACETAMINOPHEN-CODEINE 120-12 MG/5ML PO SOLN
10.0000 mL | Freq: Once | ORAL | Status: AC
  Administered 2016-03-29: 10 mL via ORAL
  Filled 2016-03-29: qty 10

## 2016-03-29 MED ORDER — HEPARIN SODIUM (PORCINE) 1000 UNIT/ML IJ SOLN
INTRAMUSCULAR | Status: AC
  Filled 2016-03-29: qty 1

## 2016-03-29 MED ORDER — LIDOCAINE-EPINEPHRINE 1 %-1:100000 IJ SOLN
20.0000 mL | Freq: Once | INTRAMUSCULAR | Status: AC
  Administered 2016-03-29: 5 mL via INTRADERMAL

## 2016-03-29 MED ORDER — SODIUM CHLORIDE 0.9 % IV BOLUS (SEPSIS)
500.0000 mL | Freq: Once | INTRAVENOUS | Status: AC
  Administered 2016-03-29: 500 mL via INTRAVENOUS

## 2016-03-29 MED ORDER — IODIXANOL 320 MG/ML IV SOLN
INTRAVENOUS | Status: DC | PRN
  Administered 2016-03-29: 140 mL via INTRAVENOUS

## 2016-03-29 MED ORDER — LIDOCAINE HCL (PF) 1 % IJ SOLN
INTRAMUSCULAR | Status: DC | PRN
  Administered 2016-03-29: 15 mL

## 2016-03-29 MED ORDER — LABETALOL HCL 5 MG/ML IV SOLN
15.0000 mg | Freq: Once | INTRAVENOUS | Status: AC
  Administered 2016-03-29: 15 mg via INTRAVENOUS

## 2016-03-29 MED ORDER — HYDROMORPHONE HCL 1 MG/ML IJ SOLN
INTRAMUSCULAR | Status: DC | PRN
  Administered 2016-03-29: 0.5 mg via INTRAVENOUS

## 2016-03-29 MED ORDER — MIDAZOLAM HCL 2 MG/2ML IJ SOLN
INTRAMUSCULAR | Status: DC | PRN
  Administered 2016-03-29: 2 mg via INTRAVENOUS

## 2016-03-29 MED ORDER — HEPARIN (PORCINE) IN NACL 2-0.9 UNIT/ML-% IJ SOLN
INTRAMUSCULAR | Status: DC | PRN
  Administered 2016-03-29: 1000 mL

## 2016-03-29 MED ORDER — ACETAMINOPHEN-CODEINE 120-12 MG/5ML PO SOLN
ORAL | Status: AC
  Filled 2016-03-29: qty 10

## 2016-03-29 MED ORDER — HEPARIN SODIUM (PORCINE) 1000 UNIT/ML IJ SOLN
INTRAMUSCULAR | Status: DC | PRN
  Administered 2016-03-29: 8000 [IU] via INTRAVENOUS
  Administered 2016-03-29: 2000 [IU] via INTRAVENOUS

## 2016-03-29 MED ORDER — NITROGLYCERIN 1 MG/10 ML FOR IR/CATH LAB
INTRA_ARTERIAL | Status: AC
  Filled 2016-03-29: qty 10

## 2016-03-29 MED ORDER — DIAZEPAM 5 MG PO TABS
5.0000 mg | ORAL_TABLET | Freq: Once | ORAL | Status: AC
  Administered 2016-03-29: 5 mg via ORAL

## 2016-03-29 MED ORDER — HYDROMORPHONE HCL 1 MG/ML IJ SOLN
INTRAMUSCULAR | Status: AC
  Filled 2016-03-29: qty 1

## 2016-03-29 MED ORDER — MIDAZOLAM HCL 2 MG/2ML IJ SOLN
INTRAMUSCULAR | Status: AC
  Filled 2016-03-29: qty 2

## 2016-03-29 SURGICAL SUPPLY — 17 items
BALLN LUTONIX DCB 6X80X130 (BALLOONS) ×3
BALLOON LUTONIX DCB 6X80X130 (BALLOONS) ×1 IMPLANT
CATH OMNI FLUSH 5F 65CM (CATHETERS) ×3 IMPLANT
CATH TURBOHAWK STAND LS-M (CATHETERS) ×2 IMPLANT
DEVICE SPIDERFX EMB PROT 6MM (WIRE) ×3 IMPLANT
KIT ENCORE 26 ADVANTAGE (KITS) ×3 IMPLANT
KIT MICROINTRODUCER STIFF 5F (SHEATH) ×3 IMPLANT
KIT PV (KITS) ×3 IMPLANT
SHEATH PINNACLE 5F 10CM (SHEATH) ×3 IMPLANT
SHEATH PINNACLE ST 7F 45CM (SHEATH) ×3 IMPLANT
SYRINGE MEDRAD AVANTA MACH 7 (SYRINGE) ×3 IMPLANT
TAPE RADIOPAQUE TURBO (MISCELLANEOUS) ×3 IMPLANT
TRANSDUCER W/STOPCOCK (MISCELLANEOUS) ×3 IMPLANT
TRAY PV CATH (CUSTOM PROCEDURE TRAY) ×3 IMPLANT
TUBING CIL FLEX 10 FLL-RA (TUBING) ×3 IMPLANT
WIRE HITORQ VERSACORE ST 145CM (WIRE) ×3 IMPLANT
WIRE SPARTACORE .014X300CM (WIRE) ×3 IMPLANT

## 2016-03-29 NOTE — Interval H&P Note (Signed)
History and Physical Interval Note:  03/29/2016 10:55 AM  Alexandria Coffey  has presented today for surgery, with the diagnosis of pvd with claudication  The various methods of treatment have been discussed with the patient and family. After consideration of risks, benefits and other options for treatment, the patient has consented to  Procedure(s): Lower Extremity Angiography (N/A) Peripheral Vascular Atherectomy as a surgical intervention .  The patient's history has been reviewed, patient examined, no change in status, stable for surgery.  I have reviewed the patient's chart and labs.  Questions were answered to the patient's satisfaction.     [name]

## 2016-03-29 NOTE — Progress Notes (Signed)
Site area: lt fa sheath pulled and pressure held  by Daleen Snook Site Prior to Removal:  Level  0 Pressure Applied For:  20 minutes Manual:   yes Patient Status During Pull:  stable Post Pull Site:  Level  0  Post Pull Instructions Given:  yes Post Pull Pulses Present: yes Dressing Applied:  tegaderm Bedrest begins @  1410 Comments:

## 2016-03-29 NOTE — Care Management Obs Status (Signed)
MEDICARE OBSERVATION STATUS NOTIFICATION   Patient Details  Name: Alexandria Coffey MRN: [id number] Date of Birth: Mar 20, 1951   Medicare Observation Status Notification Given:  Yes    Leone Haven, RN 03/29/2016, 5:32 PM

## 2016-03-29 NOTE — Progress Notes (Signed)
Pt BP elevated 170s/100s. Notified Evlyn Courier, MD and he ordered 15mg  Labetalol IV.   Will continue to monitor.   

## 2016-03-29 NOTE — Progress Notes (Signed)
Notified MD concerning pt steady oozing of sheath site. ACT is 214. Dr. Nadara Eaton ordered intradermal epi with lido to be injected around cath site. Oozing decreased slightly. Will continue to monitor. Edgardo Roys

## 2016-03-29 NOTE — Progress Notes (Signed)
Called in to patient's room for c/o "Not feeling right". CBG  =51. VS obtained. Pt awake alert and asymptomatic. A glass of orange juice and graham crackers with peanut butter provided. Post treatment CBG= 155. Pt states she had eaten very little for dinner. Heart healthy dinner provided.

## 2016-03-29 NOTE — Progress Notes (Signed)
Call placed to Dr. Jacinto Halim. Made aware of pt's CBG since 1950 51,155 an her HS CBG is 254. Informed that she has no HS sliding scale. Also while speaking with pt, she informed me that she was very anxious about a situation at home. Is requesting something for anxiety. New orders obtained.

## 2016-03-30 DIAGNOSIS — E1151 Type 2 diabetes mellitus with diabetic peripheral angiopathy without gangrene: Secondary | ICD-10-CM | POA: Diagnosis not present

## 2016-03-30 LAB — GLUCOSE, CAPILLARY: GLUCOSE-CAPILLARY: 173 mg/dL — AB (ref 65–99)

## 2016-03-30 MED ORDER — TRAMADOL HCL 50 MG PO TABS: 50.0000 mg | ORAL_TABLET | Freq: Four times a day (QID) | ORAL | Status: DC | PRN

## 2016-03-30 MED ORDER — HYDROCODONE-ACETAMINOPHEN 7.5-325 MG PO TABS
1.0000 | ORAL_TABLET | Freq: Once | ORAL | Status: AC
  Administered 2016-03-30: 09:00:00 1 via ORAL
  Filled 2016-03-30: qty 1

## 2016-03-30 MED ORDER — BENZONATATE 100 MG PO CAPS: 200.0000 mg | ORAL_CAPSULE | Freq: Three times a day (TID) | ORAL | Status: DC | PRN

## 2016-03-30 MED FILL — Lidocaine HCl Local Preservative Free (PF) Inj 1%: INTRAMUSCULAR | Qty: 30 | Status: AC

## 2016-03-30 NOTE — Discharge Summary (Signed)
Physician Discharge Summary  Patient ID: Alexandria Coffey MRN: [id number] DOB/AGE: [birth year] 65 y.o.  Admit date: 2016-04-10 Discharge date: 03/30/2016  Primary Discharge Diagnosis: PAD s/p PTA and atherectomy of the right SFA with TurboHawk LS-M directional atherectomy followed by PTCA and drug coated balloon angioplasty with 6.0 x 80 mm Lutonix balloon, 95% stenosis reduced to 0%.  Secondary Discharge Diagnosis: Type 2 Diabetes Mellitus, HTN, HLD with statin intolerance  Significant Diagnostic Studies:  Peripheral arteriogram 04/10/06: Abdominal aortogram: No evidence of distal abdominal aneurysm. Aortoiliac bifurcation was widely patent. Iliac arteries: There was nosignificant stenosis noted. Left external iliac/common femoral artery atherectomy site is widely patent with very minimal neointimal hyperplasia. Right femoral artery with distal runoff: There is a tandem 60-70% stenosis followed by 95% stenosis in the proximal to mid segment of the right SFA. There is mild diffuse disease. Below the right knee, the popliteal artery has a 50-60% stenosis, followed by anterior tibial artery with a high-grade 95% stenosis, the tibioperoneal trunk has 80% stenosis, peroneal artery has a 95% stenosis. There is three-vessel runoff below the right knee.  Hospital Course: Alexandria Coffey is a 65 y.o. female patient with a history of hypertension, hyperlipidemia and diabetes mellitus. She is intolerant to statins. Also has had known peripheral arterial disease, has undergone directional arthrectomy on 06/30/2015 to the left external iliac artery and left SFA. She has residual high-grade 90-95% stenosis of the right mid SFA. She was aggressively treated medically, but continued to have severe lifestyle limiting claudication. Hence now brought to the peripheral angiography suite to reevaluate peripheral anatomy and possible angioplasty of the right SFA.  She underwent successful PTA and atherectomy with TurboHawk LS-M  directional atherectomy followed by PTCA and drug coated balloon angioplasty with 6.0 x 80 mm Lutonix balloon, 95% stenosis reduced to 0%. Reports tenderness to left groin access site, ecchymosis present without hematoma or evidence of pseudoaneurysm.  Recommendations on discharge: Patient is on appropriate medical therapy. Tramadol given for pain and tessalon perles given for cough.Follow up outpatient as scheduled.  Discharge Exam: Blood pressure 145/90, pulse 87, temperature 98 F (36.7 C), temperature source Oral, resp. rate 19, height 5\' 2"  (1.575 m), weight 54 kg (119 lb 0.8 oz), SpO2 95 %.    General appearance: alert, cooperative, appears stated age and no distress Resp: clear to auscultation bilaterally Cardio: S1, S2 normal, no S3 or S4 and systolic murmur: early systolic 1/6, at 2nd right intercostal space Extremities: extremities normal, atraumatic, no cyanosis or edema Pulses: Right Pulses: FEM: present 2+, POP: present 1+, DP: present 2+, PT: absent Left Pulses: FEM: present 2+, POP: present 1+, DP: present 2+, PT: absent Skin: Skin color, texture, turgor normal. No rashes or lesions, left groin access site with ecchymosis, no hematoma or bruit  Labs:   Lab Results  Component Value Date   WBC 5.0 10/14/2014   HGB 11.3* 10/14/2014   HCT 34.4* 10/14/2014   MCV 70.3* 10/14/2014   PLT 277 10/14/2014   No results for input(s): NA, K, CL, CO2, BUN, CREATININE, CALCIUM, PROT, BILITOT, ALKPHOS, ALT, AST, GLUCOSE in the last 168 hours.  Invalid input(s): LABALBU  Lipid Panel  No results found for: CHOL, TRIG, HDL, CHOLHDL, VLDL, LDLCALC  BNP (last 3 results) No results for input(s): BNP in the last 8760 hours.  HEMOGLOBIN A1C No results found for: HGBA1C, MPG  Cardiac Panel (last 3 results) No results for input(s): CKTOTAL, CKMB, TROPONINI, RELINDX in the last 8760 hours.  Lab Results  Component Value Date   TROPONINI <0.03 11/29/2014     TSH No results for  input(s): TSH in the last 8760 hours.    Radiology: No results found.    FOLLOW UP PLANS AND APPOINTMENTS      Medication List    TAKE these medications        albuterol 108 (90 Base) MCG/ACT inhaler  Commonly known as:  PROVENTIL HFA;VENTOLIN HFA  Inhale 2 puffs into the lungs every 6 (six) hours as needed for wheezing or shortness of breath.     amitriptyline 50 MG tablet  Commonly known as:  ELAVIL  Take 50 mg by mouth at bedtime.  Notes to Patient:  Sleep      amLODipine-benazepril 5-20 MG capsule  Commonly known as:  LOTREL  Take 1 capsule by mouth daily.  Notes to Patient:  Blood pressure     aspirin EC 81 MG tablet  Take 81 mg by mouth daily.  Notes to Patient:  Prevent clotting in stent     benzonatate 100 MG capsule  Commonly known as:  TESSALON PERLES  Take 2 capsules (200 mg total) by mouth 3 (three) times daily as needed for cough.     cilostazol 50 MG tablet  Commonly known as:  PLETAL  Take 50 mg by mouth 2 (two) times daily.     glipiZIDE 10 MG 24 hr tablet  Commonly known as:  GLUCOTROL XL  Take 10 mg by mouth 2 (two) times daily.  Notes to Patient:  Diabetes      metFORMIN 1000 MG tablet  Commonly known as:  GLUCOPHAGE  Take 1 tablet (1,000 mg total) by mouth 2 (two) times daily.  Start taking on:  03/31/2016     ranitidine 150 MG tablet  Commonly known as:  ZANTAC  Take 150 mg by mouth 2 (two) times daily.     traMADol 50 MG tablet  Commonly known as:  ULTRAM  Take 1 tablet (50 mg total) by mouth every 6 (six) hours as needed for moderate pain.       Follow-up Information    Follow up with [name], MD.   Specialty:  Cardiology   Why:  as scheduled   Contact information:   [address] [place] [address] [phone]          Erling Conte, NP-C 03/30/2016, 8:11 AM [facility] Pager: [phone] Office: [phone]

## 2016-05-07 ENCOUNTER — Other Ambulatory Visit: Payer: Self-pay | Admitting: Internal Medicine

## 2016-05-07 DIAGNOSIS — Z1231 Encounter for screening mammogram for malignant neoplasm of breast: Secondary | ICD-10-CM

## 2016-06-17 ENCOUNTER — Ambulatory Visit
Admission: RE | Admit: 2016-06-17 | Discharge: 2016-06-17 | Disposition: A | Discharge status: Home / Self Care | Payer: Medicare Other | Source: Ambulatory Visit | Attending: Internal Medicine | Admitting: Internal Medicine

## 2016-06-17 DIAGNOSIS — Z1231 Encounter for screening mammogram for malignant neoplasm of breast: Secondary | ICD-10-CM

## 2016-06-21 ENCOUNTER — Other Ambulatory Visit: Payer: Self-pay | Admitting: Internal Medicine

## 2016-06-21 DIAGNOSIS — R928 Other abnormal and inconclusive findings on diagnostic imaging of breast: Secondary | ICD-10-CM

## 2016-06-27 ENCOUNTER — Ambulatory Visit
Admission: RE | Admit: 2016-06-27 | Discharge: 2016-06-27 | Disposition: A | Discharge status: Home / Self Care | Payer: Medicare Other | Source: Ambulatory Visit | Attending: Internal Medicine | Admitting: Internal Medicine

## 2016-06-27 DIAGNOSIS — R928 Other abnormal and inconclusive findings on diagnostic imaging of breast: Secondary | ICD-10-CM

## 2016-09-02 ENCOUNTER — Encounter (HOSPITAL_BASED_OUTPATIENT_CLINIC_OR_DEPARTMENT_OTHER): Payer: Self-pay | Admitting: *Deleted

## 2016-09-02 ENCOUNTER — Emergency Department (HOSPITAL_BASED_OUTPATIENT_CLINIC_OR_DEPARTMENT_OTHER)
Admission: EM | Admit: 2016-09-02 | Discharge: 2016-09-02 | Disposition: A | Discharge status: Home / Self Care | Payer: Medicare Other | Attending: Emergency Medicine | Admitting: Emergency Medicine

## 2016-09-02 DIAGNOSIS — E11649 Type 2 diabetes mellitus with hypoglycemia without coma: Secondary | ICD-10-CM | POA: Diagnosis present

## 2016-09-02 DIAGNOSIS — Z7984 Long term (current) use of oral hypoglycemic drugs: Secondary | ICD-10-CM | POA: Diagnosis not present

## 2016-09-02 DIAGNOSIS — Z7951 Long term (current) use of inhaled steroids: Secondary | ICD-10-CM | POA: Insufficient documentation

## 2016-09-02 DIAGNOSIS — Z87891 Personal history of nicotine dependence: Secondary | ICD-10-CM | POA: Diagnosis not present

## 2016-09-02 DIAGNOSIS — Z79899 Other long term (current) drug therapy: Secondary | ICD-10-CM | POA: Insufficient documentation

## 2016-09-02 DIAGNOSIS — Z7982 Long term (current) use of aspirin: Secondary | ICD-10-CM | POA: Diagnosis not present

## 2016-09-02 DIAGNOSIS — I10 Essential (primary) hypertension: Secondary | ICD-10-CM | POA: Insufficient documentation

## 2016-09-02 DIAGNOSIS — E162 Hypoglycemia, unspecified: Secondary | ICD-10-CM

## 2016-09-02 HISTORY — DX: Cocaine abuse, uncomplicated: F14.10

## 2016-09-02 NOTE — ED Triage Notes (Signed)
She is coming from [facility]. States her new medication is making her BS drop to low and wants the EDP to discontinue the medication or change the dosage.

## 2016-09-02 NOTE — ED Notes (Signed)
PA at bedside.
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
## 2016-09-02 NOTE — Discharge Instructions (Signed)
Discontinue taking Glipizide. Continue Metformin and Jardiance

## 2017-06-14 ENCOUNTER — Other Ambulatory Visit: Payer: Self-pay | Admitting: Internal Medicine

## 2017-06-14 DIAGNOSIS — Z1231 Encounter for screening mammogram for malignant neoplasm of breast: Secondary | ICD-10-CM

## 2017-06-17 ENCOUNTER — Encounter (HOSPITAL_BASED_OUTPATIENT_CLINIC_OR_DEPARTMENT_OTHER): Payer: Self-pay | Admitting: Emergency Medicine

## 2017-06-17 ENCOUNTER — Emergency Department (HOSPITAL_BASED_OUTPATIENT_CLINIC_OR_DEPARTMENT_OTHER)
Admission: EM | Admit: 2017-06-17 | Discharge: 2017-06-17 | Disposition: A | Discharge status: Home / Self Care | Payer: Medicare Other | Attending: Emergency Medicine | Admitting: Emergency Medicine

## 2017-06-17 ENCOUNTER — Emergency Department (HOSPITAL_BASED_OUTPATIENT_CLINIC_OR_DEPARTMENT_OTHER): Payer: Medicare Other

## 2017-06-17 DIAGNOSIS — E1151 Type 2 diabetes mellitus with diabetic peripheral angiopathy without gangrene: Secondary | ICD-10-CM | POA: Diagnosis not present

## 2017-06-17 DIAGNOSIS — L03119 Cellulitis of unspecified part of limb: Secondary | ICD-10-CM

## 2017-06-17 DIAGNOSIS — Z7902 Long term (current) use of antithrombotics/antiplatelets: Secondary | ICD-10-CM | POA: Diagnosis not present

## 2017-06-17 DIAGNOSIS — Z79899 Other long term (current) drug therapy: Secondary | ICD-10-CM | POA: Diagnosis not present

## 2017-06-17 DIAGNOSIS — M79671 Pain in right foot: Secondary | ICD-10-CM | POA: Diagnosis present

## 2017-06-17 DIAGNOSIS — M779 Enthesopathy, unspecified: Secondary | ICD-10-CM

## 2017-06-17 DIAGNOSIS — L03115 Cellulitis of right lower limb: Secondary | ICD-10-CM | POA: Insufficient documentation

## 2017-06-17 DIAGNOSIS — Z7984 Long term (current) use of oral hypoglycemic drugs: Secondary | ICD-10-CM | POA: Insufficient documentation

## 2017-06-17 DIAGNOSIS — Z87891 Personal history of nicotine dependence: Secondary | ICD-10-CM | POA: Insufficient documentation

## 2017-06-17 DIAGNOSIS — I10 Essential (primary) hypertension: Secondary | ICD-10-CM | POA: Diagnosis not present

## 2017-06-17 DIAGNOSIS — Z7982 Long term (current) use of aspirin: Secondary | ICD-10-CM | POA: Insufficient documentation

## 2017-06-17 MED ORDER — CEPHALEXIN 500 MG PO CAPS: 500.0000 mg | ORAL_CAPSULE | Freq: Three times a day (TID) | ORAL | 0 refills | Status: AC

## 2017-06-17 MED ORDER — NAPROXEN 375 MG PO TABS: 375.0000 mg | ORAL_TABLET | Freq: Two times a day (BID) | ORAL | 0 refills | Status: AC

## 2017-06-17 MED ORDER — HYDROCODONE-ACETAMINOPHEN 5-325 MG PO TABS
2.0000 | ORAL_TABLET | Freq: Once | ORAL | Status: AC
  Administered 2017-06-17: 2 via ORAL
  Filled 2017-06-17: qty 2

## 2017-06-17 MED ORDER — NAPROXEN 250 MG PO TABS
375.0000 mg | ORAL_TABLET | Freq: Once | ORAL | Status: AC
  Administered 2017-06-17: 375 mg via ORAL
  Filled 2017-06-17: qty 2

## 2017-06-17 MED ORDER — HYDROCODONE-ACETAMINOPHEN 5-325 MG PO TABS: 1.0000 | ORAL_TABLET | Freq: Four times a day (QID) | ORAL | 0 refills | Status: DC | PRN

## 2017-06-17 NOTE — Discharge Instructions (Signed)
-   Wear supportive shoes when not wearing the post-operative shoe. Try to avoid wearing sandals for at least the next 1 week. - Take the anti-inflammatories and antibiotics as prescribed

## 2017-06-17 NOTE — ED Triage Notes (Signed)
Pain to right foot since this am, denies recent injury. Pain to ant foot and post below toes, swelling noted.

## 2017-06-17 NOTE — ED Notes (Signed)
Patient transported to X-ray 
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
## 2017-06-28 ENCOUNTER — Ambulatory Visit
Admission: RE | Admit: 2017-06-28 | Discharge: 2017-06-28 | Disposition: A | Discharge status: Home / Self Care | Payer: Medicare Other | Source: Ambulatory Visit | Attending: Internal Medicine | Admitting: Internal Medicine

## 2017-06-28 DIAGNOSIS — Z1231 Encounter for screening mammogram for malignant neoplasm of breast: Secondary | ICD-10-CM

## 2017-10-31 ENCOUNTER — Emergency Department (HOSPITAL_BASED_OUTPATIENT_CLINIC_OR_DEPARTMENT_OTHER): Payer: Medicare Other

## 2017-10-31 ENCOUNTER — Inpatient Hospital Stay (HOSPITAL_BASED_OUTPATIENT_CLINIC_OR_DEPARTMENT_OTHER)
Admission: EM | Admit: 2017-10-31 | Discharge: 2017-11-07 | DRG: 193 | Disposition: A | Discharge status: Home Health | Payer: Medicare Other | Attending: Internal Medicine | Admitting: Internal Medicine

## 2017-10-31 ENCOUNTER — Encounter (HOSPITAL_BASED_OUTPATIENT_CLINIC_OR_DEPARTMENT_OTHER): Payer: Self-pay | Admitting: *Deleted

## 2017-10-31 ENCOUNTER — Other Ambulatory Visit: Payer: Self-pay

## 2017-10-31 DIAGNOSIS — E7800 Pure hypercholesterolemia, unspecified: Secondary | ICD-10-CM | POA: Diagnosis present

## 2017-10-31 DIAGNOSIS — F419 Anxiety disorder, unspecified: Secondary | ICD-10-CM | POA: Diagnosis present

## 2017-10-31 DIAGNOSIS — E1151 Type 2 diabetes mellitus with diabetic peripheral angiopathy without gangrene: Secondary | ICD-10-CM | POA: Diagnosis present

## 2017-10-31 DIAGNOSIS — R0609 Other forms of dyspnea: Secondary | ICD-10-CM

## 2017-10-31 DIAGNOSIS — M810 Age-related osteoporosis without current pathological fracture: Secondary | ICD-10-CM | POA: Diagnosis present

## 2017-10-31 DIAGNOSIS — R609 Edema, unspecified: Secondary | ICD-10-CM | POA: Diagnosis not present

## 2017-10-31 DIAGNOSIS — K219 Gastro-esophageal reflux disease without esophagitis: Secondary | ICD-10-CM | POA: Diagnosis present

## 2017-10-31 DIAGNOSIS — J189 Pneumonia, unspecified organism: Principal | ICD-10-CM | POA: Diagnosis present

## 2017-10-31 DIAGNOSIS — E875 Hyperkalemia: Secondary | ICD-10-CM | POA: Diagnosis present

## 2017-10-31 DIAGNOSIS — D509 Iron deficiency anemia, unspecified: Secondary | ICD-10-CM | POA: Diagnosis present

## 2017-10-31 DIAGNOSIS — I110 Hypertensive heart disease with heart failure: Secondary | ICD-10-CM | POA: Diagnosis present

## 2017-10-31 DIAGNOSIS — I5033 Acute on chronic diastolic (congestive) heart failure: Secondary | ICD-10-CM | POA: Diagnosis present

## 2017-10-31 DIAGNOSIS — F329 Major depressive disorder, single episode, unspecified: Secondary | ICD-10-CM | POA: Diagnosis present

## 2017-10-31 DIAGNOSIS — F1410 Cocaine abuse, uncomplicated: Secondary | ICD-10-CM | POA: Diagnosis present

## 2017-10-31 DIAGNOSIS — E11649 Type 2 diabetes mellitus with hypoglycemia without coma: Secondary | ICD-10-CM | POA: Diagnosis present

## 2017-10-31 DIAGNOSIS — Z888 Allergy status to other drugs, medicaments and biological substances status: Secondary | ICD-10-CM

## 2017-10-31 DIAGNOSIS — J440 Chronic obstructive pulmonary disease with acute lower respiratory infection: Secondary | ICD-10-CM | POA: Diagnosis present

## 2017-10-31 DIAGNOSIS — Z79899 Other long term (current) drug therapy: Secondary | ICD-10-CM

## 2017-10-31 DIAGNOSIS — I493 Ventricular premature depolarization: Secondary | ICD-10-CM | POA: Diagnosis present

## 2017-10-31 DIAGNOSIS — J9601 Acute respiratory failure with hypoxia: Secondary | ICD-10-CM

## 2017-10-31 DIAGNOSIS — E785 Hyperlipidemia, unspecified: Secondary | ICD-10-CM | POA: Diagnosis present

## 2017-10-31 DIAGNOSIS — M7989 Other specified soft tissue disorders: Secondary | ICD-10-CM | POA: Diagnosis not present

## 2017-10-31 DIAGNOSIS — J9602 Acute respiratory failure with hypercapnia: Secondary | ICD-10-CM | POA: Diagnosis present

## 2017-10-31 DIAGNOSIS — I472 Ventricular tachycardia: Secondary | ICD-10-CM | POA: Diagnosis present

## 2017-10-31 DIAGNOSIS — R911 Solitary pulmonary nodule: Secondary | ICD-10-CM | POA: Diagnosis present

## 2017-10-31 DIAGNOSIS — J181 Lobar pneumonia, unspecified organism: Secondary | ICD-10-CM | POA: Diagnosis not present

## 2017-10-31 DIAGNOSIS — Z825 Family history of asthma and other chronic lower respiratory diseases: Secondary | ICD-10-CM

## 2017-10-31 DIAGNOSIS — Z90710 Acquired absence of both cervix and uterus: Secondary | ICD-10-CM

## 2017-10-31 DIAGNOSIS — Z87891 Personal history of nicotine dependence: Secondary | ICD-10-CM | POA: Diagnosis not present

## 2017-10-31 DIAGNOSIS — I739 Peripheral vascular disease, unspecified: Secondary | ICD-10-CM

## 2017-10-31 DIAGNOSIS — J84112 Idiopathic pulmonary fibrosis: Secondary | ICD-10-CM | POA: Diagnosis present

## 2017-10-31 DIAGNOSIS — R0902 Hypoxemia: Secondary | ICD-10-CM | POA: Diagnosis present

## 2017-10-31 DIAGNOSIS — R0602 Shortness of breath: Secondary | ICD-10-CM | POA: Diagnosis present

## 2017-10-31 DIAGNOSIS — R7981 Abnormal blood-gas level: Secondary | ICD-10-CM

## 2017-10-31 DIAGNOSIS — Z7982 Long term (current) use of aspirin: Secondary | ICD-10-CM

## 2017-10-31 DIAGNOSIS — Z7984 Long term (current) use of oral hypoglycemic drugs: Secondary | ICD-10-CM

## 2017-10-31 DIAGNOSIS — J9859 Other diseases of mediastinum, not elsewhere classified: Secondary | ICD-10-CM

## 2017-10-31 HISTORY — DX: Pneumonia, unspecified organism: J18.9

## 2017-10-31 LAB — TROPONIN I: Troponin I: <0.03 ng/mL (ref <0.03)

## 2017-10-31 LAB — CBC WITH DIFFERENTIAL/PLATELET
BASOS ABS: 0 10*3/uL (ref 0.0–0.1)
Basophils Relative: 1 %
EOS PCT: 0 %
Eosinophils Absolute: 0 10*3/uL (ref 0.0–0.7)
HEMATOCRIT: 32.2 % — AB (ref 36.0–46.0)
HEMOGLOBIN: 10.5 g/dL — AB (ref 12.0–15.0)
LYMPHS ABS: 1.6 10*3/uL (ref 0.7–4.0)
LYMPHS PCT: 35 %
MCH: 22.1 pg — ABNORMAL LOW (ref 26.0–34.0)
MCHC: 32.6 g/dL (ref 30.0–36.0)
MCV: 67.6 fL — AB (ref 78.0–100.0)
MONOS PCT: 11 %
Monocytes Absolute: 0.5 10*3/uL (ref 0.1–1.0)
Neutro Abs: 2.6 10*3/uL (ref 1.7–7.7)
Neutrophils Relative %: 53 %
Platelets: 295 10*3/uL (ref 150–400)
RBC: 4.76 MIL/uL (ref 3.87–5.11)
RDW: 18.2 % — ABNORMAL HIGH (ref 11.5–15.5)
WBC: 4.7 10*3/uL (ref 4.0–10.5)

## 2017-10-31 LAB — COMPREHENSIVE METABOLIC PANEL
ALT: 10 U/L — AB (ref 14–54)
ANION GAP: 7 (ref 5–15)
AST: 17 U/L (ref 15–41)
Albumin: 2.7 g/dL — ABNORMAL LOW (ref 3.5–5.0)
Alkaline Phosphatase: 105 U/L (ref 38–126)
BUN: 11 mg/dL (ref 6–20)
CHLORIDE: 97 mmol/L — AB (ref 101–111)
CO2: 30 mmol/L (ref 22–32)
Calcium: 8.3 mg/dL — ABNORMAL LOW (ref 8.9–10.3)
Creatinine, Ser: 0.78 mg/dL (ref 0.44–1.00)
Glucose, Bld: 109 mg/dL — ABNORMAL HIGH (ref 65–99)
Potassium: 4.5 mmol/L (ref 3.5–5.1)
Sodium: 134 mmol/L — ABNORMAL LOW (ref 135–145)
TOTAL PROTEIN: 7.4 g/dL (ref 6.5–8.1)
Total Bilirubin: 0.7 mg/dL (ref 0.3–1.2)

## 2017-10-31 LAB — I-STAT ARTERIAL BLOOD GAS, ED
Acid-Base Excess: 4 mmol/L — ABNORMAL HIGH (ref 0.0–2.0)
BICARBONATE: 30.4 mmol/L — AB (ref 20.0–28.0)
O2 Saturation: 92 %
PCO2 ART: 54.5 mmHg — AB (ref 32.0–48.0)
PO2 ART: 68 mmHg — AB (ref 83.0–108.0)
Patient temperature: 37
TCO2: 32 mmol/L (ref 22–32)
pH, Arterial: 7.355 (ref 7.350–7.450)

## 2017-10-31 LAB — GLUCOSE, CAPILLARY: Glucose-Capillary: 151 mg/dL — ABNORMAL HIGH (ref 65–99)

## 2017-10-31 LAB — I-STAT CG4 LACTIC ACID, ED: LACTIC ACID, VENOUS: 1.66 mmol/L (ref 0.5–1.9)

## 2017-10-31 LAB — URINALYSIS, ROUTINE W REFLEX MICROSCOPIC
Bilirubin Urine: NEGATIVE
GLUCOSE, UA: NEGATIVE mg/dL
Hgb urine dipstick: NEGATIVE
KETONES UR: NEGATIVE mg/dL
LEUKOCYTES UA: NEGATIVE
NITRITE: NEGATIVE
PH: 7.5 (ref 5.0–8.0)
Protein, ur: NEGATIVE mg/dL
SPECIFIC GRAVITY, URINE: 1.01 (ref 1.005–1.030)

## 2017-10-31 LAB — BRAIN NATRIURETIC PEPTIDE: B NATRIURETIC PEPTIDE 5: 805.9 pg/mL — AB (ref 0.0–100.0)

## 2017-10-31 MED ORDER — ENOXAPARIN SODIUM 40 MG/0.4ML ~~LOC~~ SOLN
40.0000 mg | SUBCUTANEOUS | Status: DC
  Administered 2017-10-31 – 2017-11-06 (×6): 40 mg via SUBCUTANEOUS
  Filled 2017-10-31 (×7): qty 0.4

## 2017-10-31 MED ORDER — BENAZEPRIL HCL 20 MG PO TABS
20.0000 mg | ORAL_TABLET | Freq: Every day | ORAL | Status: DC
  Administered 2017-10-31 – 2017-11-01 (×2): 20 mg via ORAL
  Filled 2017-10-31 (×2): qty 1

## 2017-10-31 MED ORDER — IOPAMIDOL (ISOVUE-370) INJECTION 76%
100.0000 mL | Freq: Once | INTRAVENOUS | Status: AC | PRN
  Administered 2017-10-31: 100 mL via INTRAVENOUS

## 2017-10-31 MED ORDER — ASPIRIN EC 81 MG PO TBEC
81.0000 mg | DELAYED_RELEASE_TABLET | Freq: Every day | ORAL | Status: DC
  Administered 2017-11-01 – 2017-11-07 (×7): 81 mg via ORAL
  Filled 2017-10-31 (×7): qty 1

## 2017-10-31 MED ORDER — DEXTROSE 5 % IV SOLN
500.0000 mg | Freq: Once | INTRAVENOUS | Status: AC
  Administered 2017-10-31: 500 mg via INTRAVENOUS
  Filled 2017-10-31: qty 500

## 2017-10-31 MED ORDER — AMITRIPTYLINE HCL 50 MG PO TABS
50.0000 mg | ORAL_TABLET | Freq: Every day | ORAL | Status: DC
  Administered 2017-10-31 – 2017-11-04 (×5): 50 mg via ORAL
  Filled 2017-10-31 (×6): qty 1

## 2017-10-31 MED ORDER — AMLODIPINE BESYLATE 5 MG PO TABS
5.0000 mg | ORAL_TABLET | Freq: Every day | ORAL | Status: DC
  Administered 2017-10-31: 5 mg via ORAL
  Filled 2017-10-31 (×2): qty 1

## 2017-10-31 MED ORDER — GLIPIZIDE ER 10 MG PO TB24
10.0000 mg | ORAL_TABLET | Freq: Two times a day (BID) | ORAL | Status: DC
  Administered 2017-11-01 – 2017-11-02 (×3): 10 mg via ORAL
  Filled 2017-10-31 (×3): qty 1

## 2017-10-31 MED ORDER — DEXTROSE 5 % IV SOLN
500.0000 mg | INTRAVENOUS | Status: DC
  Administered 2017-11-01 – 2017-11-03 (×3): 500 mg via INTRAVENOUS
  Filled 2017-10-31 (×4): qty 500

## 2017-10-31 MED ORDER — DEXTROSE 5 % IV SOLN
1.0000 g | Freq: Once | INTRAVENOUS | Status: AC
  Administered 2017-10-31: 1 g via INTRAVENOUS
  Filled 2017-10-31: qty 10

## 2017-10-31 MED ORDER — ALBUTEROL SULFATE (2.5 MG/3ML) 0.083% IN NEBU: 3.0000 mL | INHALATION_SOLUTION | Freq: Four times a day (QID) | RESPIRATORY_TRACT | Status: DC | PRN

## 2017-10-31 MED ORDER — INSULIN ASPART 100 UNIT/ML ~~LOC~~ SOLN
0.0000 [IU] | Freq: Three times a day (TID) | SUBCUTANEOUS | Status: DC
  Administered 2017-11-01: 3 [IU] via SUBCUTANEOUS
  Administered 2017-11-02 – 2017-11-04 (×3): 1 [IU] via SUBCUTANEOUS
  Administered 2017-11-05: 5 [IU] via SUBCUTANEOUS
  Administered 2017-11-05: 1 [IU] via SUBCUTANEOUS
  Administered 2017-11-05 – 2017-11-06 (×2): 2 [IU] via SUBCUTANEOUS
  Administered 2017-11-06: 3 [IU] via SUBCUTANEOUS
  Administered 2017-11-07: 9 [IU] via SUBCUTANEOUS
  Administered 2017-11-07: 2 [IU] via SUBCUTANEOUS

## 2017-10-31 MED ORDER — EMPAGLIFLOZIN 10 MG PO TABS: 10.0000 mg | ORAL_TABLET | Freq: Every day | ORAL | Status: DC

## 2017-10-31 MED ORDER — AZITHROMYCIN 500 MG IV SOLR
INTRAVENOUS | Status: AC
  Filled 2017-10-31: qty 500

## 2017-10-31 MED ORDER — FAMOTIDINE 20 MG PO TABS
10.0000 mg | ORAL_TABLET | Freq: Two times a day (BID) | ORAL | Status: DC
  Administered 2017-10-31 – 2017-11-07 (×14): 10 mg via ORAL
  Filled 2017-10-31 (×14): qty 1

## 2017-10-31 MED ORDER — SODIUM CHLORIDE 0.9 % IV BOLUS (SEPSIS)
1000.0000 mL | Freq: Once | INTRAVENOUS | Status: AC
  Administered 2017-10-31: 1000 mL via INTRAVENOUS

## 2017-10-31 MED ORDER — ONDANSETRON HCL 4 MG/2ML IJ SOLN
4.0000 mg | Freq: Four times a day (QID) | INTRAMUSCULAR | Status: DC | PRN
  Administered 2017-10-31: 4 mg via INTRAVENOUS
  Filled 2017-10-31: qty 2

## 2017-10-31 MED ORDER — HYDROCODONE-ACETAMINOPHEN 5-325 MG PO TABS
1.0000 | ORAL_TABLET | Freq: Four times a day (QID) | ORAL | Status: DC | PRN
  Administered 2017-10-31: 1 via ORAL
  Filled 2017-10-31: qty 1

## 2017-10-31 MED ORDER — DEXTROSE 5 % IV SOLN
1.0000 g | INTRAVENOUS | Status: DC
  Administered 2017-11-01 – 2017-11-06 (×6): 1 g via INTRAVENOUS
  Filled 2017-10-31 (×9): qty 10

## 2017-10-31 MED ORDER — METFORMIN HCL 500 MG PO TABS
1000.0000 mg | ORAL_TABLET | Freq: Two times a day (BID) | ORAL | Status: DC
  Administered 2017-11-01 – 2017-11-02 (×3): 1000 mg via ORAL
  Filled 2017-10-31 (×3): qty 2

## 2017-10-31 MED ORDER — INSULIN ASPART 100 UNIT/ML ~~LOC~~ SOLN: 0.0000 [IU] | Freq: Every day | SUBCUTANEOUS | Status: DC

## 2017-10-31 MED ORDER — CILOSTAZOL 50 MG PO TABS
50.0000 mg | ORAL_TABLET | Freq: Two times a day (BID) | ORAL | Status: DC
  Administered 2017-10-31 – 2017-11-07 (×14): 50 mg via ORAL
  Filled 2017-10-31 (×16): qty 1
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
## 2017-10-31 NOTE — Progress Notes (Signed)
Coming from [facility] with abnormal CT chest and Hypoxia/Dyspnea- please Pulmonology when she arrives. CT Chest Impression- No pulm embolism, There is abnormal mediastinal and upper abdominal adenopathy. There may be bilateral hilar adenopathy. ????? Malignancy

## 2017-10-31 NOTE — ED Notes (Signed)
Urine was collected prior to antibiotics.

## 2017-10-31 NOTE — ED Notes (Signed)
If needed, pts daughter, Synetta Fail, can be reached at [phone]

## 2017-10-31 NOTE — ED Triage Notes (Signed)
Sob for a week. Today she started having chest pain that feels like gas.
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
## 2017-11-01 ENCOUNTER — Encounter (HOSPITAL_COMMUNITY): Payer: Self-pay | Admitting: General Practice

## 2017-11-01 ENCOUNTER — Inpatient Hospital Stay (HOSPITAL_COMMUNITY): Payer: Medicare Other

## 2017-11-01 DIAGNOSIS — J181 Lobar pneumonia, unspecified organism: Secondary | ICD-10-CM

## 2017-11-01 LAB — COMPREHENSIVE METABOLIC PANEL
ALK PHOS: 103 U/L (ref 38–126)
ALT: 9 U/L — ABNORMAL LOW (ref 14–54)
ANION GAP: 7 (ref 5–15)
AST: 20 U/L (ref 15–41)
Albumin: 2.4 g/dL — ABNORMAL LOW (ref 3.5–5.0)
BILIRUBIN TOTAL: 0.3 mg/dL (ref 0.3–1.2)
BUN: 7 mg/dL (ref 6–20)
CO2: 29 mmol/L (ref 22–32)
CREATININE: 0.97 mg/dL (ref 0.44–1.00)
Calcium: 8.2 mg/dL — ABNORMAL LOW (ref 8.9–10.3)
Chloride: 103 mmol/L (ref 101–111)
GFR, EST NON AFRICAN AMERICAN: 60 mL/min — AB (ref >60)
Glucose, Bld: 122 mg/dL — ABNORMAL HIGH (ref 65–99)
Potassium: 4.3 mmol/L (ref 3.5–5.1)
SODIUM: 139 mmol/L (ref 135–145)
TOTAL PROTEIN: 6.8 g/dL (ref 6.5–8.1)

## 2017-11-01 LAB — GLUCOSE, CAPILLARY
GLUCOSE-CAPILLARY: 110 mg/dL — AB (ref 65–99)
GLUCOSE-CAPILLARY: 225 mg/dL — AB (ref 65–99)
GLUCOSE-CAPILLARY: 34 mg/dL — AB (ref 65–99)
Glucose-Capillary: 115 mg/dL — ABNORMAL HIGH (ref 65–99)
Glucose-Capillary: 49 mg/dL — ABNORMAL LOW (ref 65–99)
Glucose-Capillary: 81 mg/dL (ref 65–99)

## 2017-11-01 LAB — CBC WITH DIFFERENTIAL/PLATELET
BASOS PCT: 0 %
Basophils Absolute: 0 10*3/uL (ref 0.0–0.1)
EOS ABS: 0 10*3/uL (ref 0.0–0.7)
EOS PCT: 1 %
HCT: 34.2 % — ABNORMAL LOW (ref 36.0–46.0)
Hemoglobin: 10.2 g/dL — ABNORMAL LOW (ref 12.0–15.0)
LYMPHS PCT: 37 %
Lymphs Abs: 1.6 10*3/uL (ref 0.7–4.0)
MCH: 21.7 pg — AB (ref 26.0–34.0)
MCHC: 29.8 g/dL — AB (ref 30.0–36.0)
MCV: 72.6 fL — AB (ref 78.0–100.0)
MONO ABS: 0.6 10*3/uL (ref 0.1–1.0)
Monocytes Relative: 14 %
NEUTROS ABS: 2.1 10*3/uL (ref 1.7–7.7)
Neutrophils Relative %: 48 %
Platelets: 263 10*3/uL (ref 150–400)
RBC: 4.71 MIL/uL (ref 3.87–5.11)
RDW: 18 % — ABNORMAL HIGH (ref 11.5–15.5)
WBC: 4.3 10*3/uL (ref 4.0–10.5)

## 2017-11-01 LAB — HIV ANTIBODY (ROUTINE TESTING W REFLEX): HIV SCREEN 4TH GENERATION: NONREACTIVE

## 2017-11-01 LAB — URINE CULTURE: Culture: <10000 — AB

## 2017-11-01 LAB — TROPONIN I

## 2017-11-01 LAB — MAGNESIUM: MAGNESIUM: 1.6 mg/dL — AB (ref 1.7–2.4)

## 2017-11-01 LAB — CBC
HCT: 32.8 % — ABNORMAL LOW (ref 36.0–46.0)
Hemoglobin: 10 g/dL — ABNORMAL LOW (ref 12.0–15.0)
MCH: 21.8 pg — AB (ref 26.0–34.0)
MCHC: 30.5 g/dL (ref 30.0–36.0)
MCV: 71.6 fL — ABNORMAL LOW (ref 78.0–100.0)
PLATELETS: 261 10*3/uL (ref 150–400)
RBC: 4.58 MIL/uL (ref 3.87–5.11)
RDW: 18 % — ABNORMAL HIGH (ref 11.5–15.5)
WBC: 4.6 10*3/uL (ref 4.0–10.5)

## 2017-11-01 LAB — ECHOCARDIOGRAM COMPLETE
Height: 62 in
Weight: 1840 oz

## 2017-11-01 MED ORDER — ENSURE ENLIVE PO LIQD
237.0000 mL | Freq: Two times a day (BID) | ORAL | Status: DC
  Administered 2017-11-01 – 2017-11-07 (×10): 237 mL via ORAL

## 2017-11-01 MED ORDER — HYDROCODONE-ACETAMINOPHEN 5-325 MG PO TABS
1.0000 | ORAL_TABLET | Freq: Four times a day (QID) | ORAL | Status: DC | PRN
  Administered 2017-11-01 – 2017-11-04 (×4): 1 via ORAL
  Filled 2017-11-01 (×4): qty 1

## 2017-11-01 MED ORDER — METOPROLOL TARTRATE 25 MG PO TABS
25.0000 mg | ORAL_TABLET | Freq: Two times a day (BID) | ORAL | Status: DC
  Administered 2017-11-01 – 2017-11-07 (×11): 25 mg via ORAL
  Filled 2017-11-01 (×13): qty 1

## 2017-11-01 MED ORDER — MAGNESIUM SULFATE 4 GM/100ML IV SOLN
4.0000 g | Freq: Once | INTRAVENOUS | Status: AC
  Administered 2017-11-01: 4 g via INTRAVENOUS
  Filled 2017-11-01: qty 100

## 2017-11-01 MED ORDER — INFLUENZA VAC SPLIT HIGH-DOSE 0.5 ML IM SUSY
0.5000 mL | PREFILLED_SYRINGE | INTRAMUSCULAR | Status: DC
  Filled 2017-11-01: qty 0.5

## 2017-11-01 MED ORDER — GUAIFENESIN-DM 100-10 MG/5ML PO SYRP
5.0000 mL | ORAL_SOLUTION | ORAL | Status: DC | PRN
Start: 2017-11-01 — End: 2017-11-07
  Administered 2017-11-01 – 2017-11-06 (×5): 5 mL via ORAL
  Filled 2017-11-01 (×5): qty 5
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
## 2017-11-01 NOTE — Progress Notes (Addendum)
PROGRESS NOTE    Alexandria Coffey  HBZ:[id number] DOB: [birth year] DOA: 10/31/2017 PCP: Fleet Contras, MD   Brief Narrative: 66 y.o. female, w dm2, hypertension, hyperlipidemia, coccaine, abuse, PVD s/p PTA and atherectomy 03/29/2016 apparently c/o dyspnea for the past week. Worse today.  Slight cough w yellow sputum.  Slight substernal chest pain.  No radiation.  Denies fever, chills, palp, n/v, diarrhea, brbpr.  Pt presented to ED due to dyspnea and cough, and chest pain .  Patient was hypoxic in the emergency room her saturation was 70 on room air.  CT of the chest showed  There is no evidence of acute pulmonary thromboembolism.  Pulmonary artery remains dilated worrisome for elevated right heart pressure.  There is abnormal mediastinal and upper abdominal adenopathy. There may be bilateral hilar adenopathy. Malignancy is not excluded.  Fibrotic changes throughout the right lung have progressed  Confluent opacity at the right apex measuring 1.3 cm. Initial follow-up by chest CT without contrast is recommended in 3 months to confirm persistence. This recommendation follows the consensus statement: Recommendations for the Management of Subsolid Pulmonary Nodules Detected at CT: A Statement from the Fleischner Society as published in Radiology 2013; 266:304-317.  Elevation of the left hemidiaphragm is associated with left basilar atelectasis versus airspace disease.  Small left pleural effusion.    Assessment & Plan:   Principal Problem:   CAP (community acquired pneumonia) Active Problems:   Diabetes mellitus with peripheral artery disease (HCC)   Hypoxia   Pneumonia  Hypoxia secondary to lung fibrosis versus pneumonia/right apical pulmonary nodule/COPD tobacco abuse-on rocephin/azithro. Cocaine abuse.on 3 lit of oxygen sat above 90%.appreciae pccm input.  V. Tach/pvc-patient had negative cardiac enzymes.  Potassium 4.5 I do not have a magnesium but I will go ahead and  give her a dose of mag sulfate.  Stat labs ordered.  Cardiology consult placed today.start beta blocker.  DM continue metformin,glucotrol.  History of htn now low to normal.  PAD- Continue pletal.history of angioplasty.  History of cocaine abuse-add uds.    DVT prophylaxis Lovenox :Code Status:full Family Communication: No family available Disposition Plan TBD [facility] Pul[facility], cardiology Procedures: None  Antimicrobials: Rocephin and azithromycin  Subjective: She denies chest pain, does complain of some cough.  Objective: Resting in bed sleeping.  I was able to wake her up she was able to keep her eyes open for a brief period of time then goes back to sleep. Vitals:   10/31/17 2159 11/01/17 0617 11/01/17 0841 11/01/17 1330  BP: (!) 125/55 (!) 79/61 117/63 128/62  Pulse: 93 92  (!) 104  Resp: 16 16    Temp: 97.9 F (36.6 C) (!) 97.5 F (36.4 C)  98.2 F (36.8 C)  TempSrc:      SpO2: 99% 97%  99%  Weight:      Height:        Intake/Output Summary (Last 24 hours) at 11/01/2017 1341 Last data filed at 11/01/2017 1327 Gross per 24 hour  Intake -  Output 1 ml  Net -1 ml   Filed Weights   10/31/17 1345 10/31/17 1855  Weight: 52.2 kg (115 lb) 52.2 kg (115 lb)    Examination:  General exam: Appears calm and comfortable  Respiratory system: rhonchi right auscultation. Respiratory effort normal. Cardiovascular system: S1 & S2 heard, RRR. No JVD, murmurs, rubs, gallops or clicks. No pedal edema. Gastrointestinal system: Abdomen is nondistended, soft and nontender. No organomegaly or masses felt. Normal bowel sounds heard. Central nervous system: Alert  and oriented. No focal neurological deficits. Extremities: Symmetric 5 x 5 power. Skin: No rashes, lesions or ulcers   Data Reviewed: I have personally reviewed following labs and imaging studies  CBC: Recent Labs  Lab 10/31/17 1420 11/01/17 0422  WBC 4.7 4.6  NEUTROABS 2.6  --   HGB 10.5* 10.0*  HCT 32.2*  32.8*  MCV 67.6* 71.6*  PLT 295 261   Basic Metabolic Panel: Recent Labs  Lab 10/31/17 1420  NA 134*  K 4.5  CL 97*  CO2 30  GLUCOSE 109*  BUN 11  CREATININE 0.78  CALCIUM 8.3*   GFR: Estimated Creatinine Clearance: 54.7 mL/min (by C-G formula based on SCr of 0.78 mg/dL). Liver Function Tests: Recent Labs  Lab 10/31/17 1420  AST 17  ALT 10*  ALKPHOS 105  BILITOT 0.7  PROT 7.4  ALBUMIN 2.7*   No results for input(s): LIPASE, AMYLASE in the last 168 hours. No results for input(s): AMMONIA in the last 168 hours. Coagulation Profile: No results for input(s): INR, PROTIME in the last 168 hours. Cardiac Enzymes: Recent Labs  Lab 10/31/17 1420 10/31/17 2222 11/01/17 0422 11/01/17 0922  TROPONINI <0.03 <0.03 <0.03 <0.03   BNP (last 3 results) No results for input(s): PROBNP in the last 8760 hours. HbA1C: No results for input(s): HGBA1C in the last 72 hours. CBG: Recent Labs  Lab 10/31/17 2219 11/01/17 0813 11/01/17 1204  GLUCAP 151* 110* 115*   Lipid Profile: No results for input(s): CHOL, HDL, LDLCALC, TRIG, CHOLHDL, LDLDIRECT in the last 72 hours. Thyroid Function Tests: No results for input(s): TSH, T4TOTAL, FREET4, T3FREE, THYROIDAB in the last 72 hours. Anemia Panel: No results for input(s): VITAMINB12, FOLATE, FERRITIN, TIBC, IRON, RETICCTPCT in the last 72 hours. Sepsis Labs: Recent Labs  Lab 10/31/17 1442  LATICACIDVEN 1.66    No results found for this or any previous visit (from the past 240 hour(s)).       Radiology Studies: Dg Chest 2 View  Result Date: 10/31/2017 CLINICAL DATA:  Shortness of breath.  Chest pain . EXAM: CHEST  2 VIEW COMPARISON:  09/17/2013 .  12/08/2009.  CT 08/10/2012. FINDINGS: Prior median sternotomy. Surgical clips left upper chest. Stable cardiomegaly scratched it cardiomegaly. Mild increase interstitial markings noted bilaterally, the mild CHF cannot be excluded. Mild pneumonitis cannot be excluded. Underlying  chronic interstitial lung disease is most likely present. Stable elevation left hemidiaphragm. IMPRESSION: 1. Prior median sternotomy. Cardiomegaly. Mild bilateral interstitial prominence. Mild CHF cannot be excluded. Mild pneumonitis cannot be excluded. 2.  Underlying chronic interstitial lung disease. Electronically Signed   By: Maisie Fus  Register   On: 10/31/2017 14:32   Ct Angio Chest Pe W And/or Wo Contrast  Result Date: 10/31/2017 CLINICAL DATA:  Short of breath and chest pain EXAM: CT ANGIOGRAPHY CHEST WITH CONTRAST TECHNIQUE: Multidetector CT imaging of the chest was performed using the standard protocol during bolus administration of intravenous contrast. Multiplanar CT image reconstructions and MIPs were obtained to evaluate the vascular anatomy. CONTRAST:  ISOVUE-370 IOPAMIDOL (ISOVUE-370) INJECTION 76% COMPARISON:  08/10/2012 FINDINGS: Cardiovascular: There are no filling defects in the pulmonary arterial tree to suggest acute pulmonary thromboembolism. The main pulmonary artery is markedly dilated with a diameter of 3.8 cm. This is not significantly changed. The right atrium is dilated. There are atherosclerotic calcifications of the aortic arch. Mild coronary artery calcifications. Injection of contrast was performed in the left upper extremity. 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 Collateral venous structures reconstitute the  right innominate vein and SVC. Mediastinum/Nodes: Abnormal prevascular enlarged lymph nodes are present. 1.9 cm node on image 26. 1.1 cm prevascular node on image 24. These are both larger than on the prior study. Prominent peribronchovascular soft tissues in the hilar regions is also present. Hilar adenopathy cannot be excluded. No pericardial effusion. Thyroid is heterogeneous. Lungs/Pleura: No pneumothorax. Small left pleural effusion. Irregular linear and fibrotic changes throughout the right lung have progressed. There is more confluent opacity at  the right apex on image 17 measuring 1.3 cm. The left hemidiaphragm is markedly elevated and there is atelectasis versus airspace disease at the left lung base. Upper Abdomen: There is abnormal adenopathy in the gastrohepatic ligament. 12 mm short axis diameter gastrohepatic ligament node on image 74 is larger. Musculoskeletal: There are metallic fragments in an about the medial left clavicle. There is deformity of the clavicle which has a chronic appearance. Stable thoracic spine. Review of the MIP images confirms the above findings. IMPRESSION: There is no evidence of acute pulmonary thromboembolism. Pulmonary artery remains dilated worrisome for elevated right heart pressure. There is abnormal mediastinal and upper abdominal adenopathy. There may be bilateral hilar adenopathy. Malignancy is not excluded. Fibrotic changes throughout the right lung have progressed Confluent opacity at the right apex measuring 1.3 cm. Initial follow-up by chest CT without contrast is recommended in 3 months to confirm persistence. This recommendation follows the consensus statement: Recommendations for the Management of Subsolid Pulmonary Nodules Detected at CT: A Statement from the Fleischner Society as published in Radiology 2013; 266:304-317. Elevation of the left hemidiaphragm is associated with left basilar atelectasis versus airspace disease. Small left pleural effusion. 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 Aortic Atherosclerosis (ICD10-I70.0). Electronically Signed   By: Jolaine Click M.D.   On: 10/31/2017 15:32        Scheduled Meds: . amitriptyline  50 mg Oral QHS  . amLODipine  5 mg Oral Daily  . aspirin EC  81 mg Oral Daily  . benazepril  20 mg Oral Daily  . cilostazol  50 mg Oral BID  . enoxaparin (LOVENOX) injection  40 mg Subcutaneous Q24H  . famotidine  10 mg Oral BID  . glipiZIDE  10 mg Oral BID WC  . insulin aspart  0-5 Units Subcutaneous QHS  . insulin aspart  0-9 Units Subcutaneous TID WC   . metFORMIN  1,000 mg Oral BID WC   Continuous Infusions: . azithromycin    . cefTRIAXone (ROCEPHIN)  IV    . magnesium sulfate 1 - 4 g bolus IVPB       LOS: 1 day     Alwyn Ren, MD [facility]  If 7PM-7AM, please contact night-coverage [email or web address] Password [email or web address] 11/01/2017, 1:41 PM

## 2017-11-01 NOTE — Progress Notes (Signed)
Cardiologist came and spoke with the patient and family. He stated she did not have V tach today it was artifacts. The doctor answered the patient's concerns and questions about her heart and lung problems.

## 2017-11-01 NOTE — Progress Notes (Signed)
Patient was experiencing V tach runs in groups with PVCs. Patient was assessed and there were no changes from this mornings assessment. The patients leads were in poor placement so the patients skin was prepped and I put on new leads. Patients vitals are stable and she is not symptomatic. Patient responded and was alert and oriented. MD was notified and made aware. MD is putting in a cardiology consult and was made known that she was experiencing V tach yesterday. Patient has also been drowsy all night and day.

## 2017-11-01 NOTE — Progress Notes (Signed)
Patient is still stable and is in sinus rhythm with a HR of 76. The metoprolol has helped along with the other interventions. MD has been continuously in contact and given updates.

## 2017-11-01 NOTE — Progress Notes (Signed)
Patient and family want the patients PCP Fleet Contras to be notified of her being in the hospital. Patient does not recall speaking with a pulmonary doctor. And has many questions and concerns about possible cancer. If the pulmonary doctor will come back and explain that would be appreciated.

## 2017-11-01 NOTE — Progress Notes (Signed)
  Echocardiogram 2D Echocardiogram has been performed.  Janalyn Harder 11/01/2017, 12:07 PM

## 2017-11-02 LAB — GLUCOSE, CAPILLARY
GLUCOSE-CAPILLARY: 85 mg/dL (ref 65–99)
GLUCOSE-CAPILLARY: 44 mg/dL — AB (ref 65–99)
GLUCOSE-CAPILLARY: 88 mg/dL (ref 65–99)
GLUCOSE-CAPILLARY: 113 mg/dL — AB (ref 65–99)
Glucose-Capillary: 142 mg/dL — ABNORMAL HIGH (ref 65–99)
Glucose-Capillary: 38 mg/dL — CL (ref 65–99)
Glucose-Capillary: 164 mg/dL — ABNORMAL HIGH (ref 65–99)
Glucose-Capillary: 45 mg/dL — ABNORMAL LOW (ref 65–99)

## 2017-11-02 MED ORDER — DEXTROSE 50 % IV SOLN
INTRAVENOUS | Status: AC
  Administered 2017-11-02: 50 mL
  Filled 2017-11-02: qty 50

## 2017-11-02 MED ORDER — DEXTROSE 5 % IV SOLN
INTRAVENOUS | Status: DC
  Administered 2017-11-02 – 2017-11-04 (×4): via INTRAVENOUS

## 2017-11-02 MED ORDER — HYDROXYZINE HCL 10 MG PO TABS
10.0000 mg | ORAL_TABLET | Freq: Four times a day (QID) | ORAL | Status: DC | PRN
  Administered 2017-11-02 – 2017-11-04 (×2): 10 mg via ORAL
  Filled 2017-11-02 (×3): qty 1

## 2017-11-02 NOTE — Progress Notes (Signed)
CBG during HS 39.  PO juice given and a recheck was 49.  Graham crackers with peanut butter and juice given.  A rechecked CBG was 81 and 85.  Pt was not symptomatic.

## 2017-11-02 NOTE — Progress Notes (Signed)
Inpatient Diabetes Program Recommendations  AACE/ADA: New Consensus Statement on Inpatient Glycemic Control (2015)  Target Ranges:  Prepandial:   less than 140 mg/dL      Peak postprandial:   less than 180 mg/dL (1-2 hours)      Critically ill patients:  140 - 180 mg/dL   Lab Results  Component Value Date   GLUCAP 142 (H) 11/02/2017    Review of Glycemic Control Results for Alexandria Coffey, Alexandria Coffey (MRN [id number]) as of 11/02/2017 11:34  Ref. Range 11/01/2017 16:14 11/01/2017 20:54 11/01/2017 22:05 11/01/2017 23:32 11/02/2017 01:11 11/02/2017 08:11  Glucose-Capillary Latest Ref Range: 65 - 99 mg/dL 329 (H) 34 (LL) 49 (L) 81 85 142 (H)   Diabetes history: DM2 Outpatient Diabetes medications: Glucotrol 10 mg bid  + Metformin 500 mg am + 1 gm pm Current orders for Inpatient glycemic control: Glucotrol 10 mg bid + Metformin 1 gm bid + Novolog sensitive correction + hs  Inpatient Diabetes Program Recommendations:   Patient's CBG decreased to 34 last hs. Text page to Dr. Jerolyn Center requesting D/C of Glucotrol while in the hospital.  Thank you, Alexandria Coffey. Alexandria Shedden, RN, MSN, CDE  Diabetes Coordinator Inpatient Glycemic Control Team Team Pager [phone] (8am-5pm) 11/02/2017 11:39 AM

## 2017-11-02 NOTE — Progress Notes (Signed)
MD ordered medicine for her itching from the pain meds and ordered to increase IV fluids.

## 2017-11-02 NOTE — Progress Notes (Signed)
Hypoglycemic Event  CBG: 44  Treatment: 15 GM carbohydrate snack  Symptoms: Sleepy   Follow-up CBG: Time:1220 CBG Result:38  Possible Reasons for Event: Inadequate meal intake  Comments/MD notified:I gave her orange juice immediately. I rechecked and it went down so I gave 50 mL Dextrose.    Alexandria Coffey

## 2017-11-02 NOTE — Progress Notes (Signed)
Hypoglycemic Event  CBG: 38  Treatment: D50 IV 50 mL  Symptoms: Shaky  Follow-up CBG: Time: 1249 CBG Result:164  Possible Reasons for Event: Medication regimen: Her oral antidiabetics  Comments/MD notified:Her sugar went down after the orange juice the patient is not eating enough.     Alexandria Coffey

## 2017-11-02 NOTE — Progress Notes (Signed)
MD notified of BP of 97/57 and HR of 76. I asked to hold metoprolol and the doctor agreed

## 2017-11-02 NOTE — Progress Notes (Signed)
Initial Nutrition Assessment  DOCUMENTATION CODES:   Not applicable  INTERVENTION:  1. Glucerna Shake po TID, each supplement provides 220 kcal and 10 grams of protein  NUTRITION DIAGNOSIS:   Increased nutrient needs related to acute illness as evidenced by estimated needs.  GOAL:   Patient will meet greater than or equal to 90% of their needs  MONITOR:   PO intake, I & O's, Labs, Supplement acceptance, Weight trends  REASON FOR ASSESSMENT:   Malnutrition Screening Tool    ASSESSMENT:   66 y.o. female, w dm2, hypertension, hyperlipidemia, coccaine, abuse, PVD s/p PTA and atherectomy 03/29/2016 apparently c/o dyspnea for the past week presents with CAP, Hypoxia  Spoke with Ms. Allsbrook at bedside. She reports not eating anything for the past 2 days, and eating "a couple forkfuls" of grits and eggs for breakfast for the past month to 1.5 months, with no other PO intake. RD attempted to clarify but patient insists this was her normal PO intake. Reports she has lost a lot of weight "20 pounds or so." NFPE appears WNL. Per chart patient does not exhibit any weight loss between her admission on 7/21 and now. Unsure of accuracy of reported PO intake and weight loss. Meal completion 85% yesterday. Agreeable to drinking ensure .  Labs reviewed:  CBGs 142, 85, 81 Medications reviewed and include:  Insulin, Glipizide, Metformin    NUTRITION - FOCUSED PHYSICAL EXAM:    Most Recent Value  Orbital Region  No depletion  Upper Arm Region  No depletion  Thoracic and Lumbar Region  No depletion  Buccal Region  Mild depletion  Temple Region  No depletion  Clavicle Bone Region  No depletion  Clavicle and Acromion Bone Region  No depletion  Scapular Bone Region  No depletion  Dorsal Hand  No depletion  Patellar Region  No depletion  Anterior Thigh Region  No depletion  Posterior Calf Region  No depletion  Edema (RD Assessment)  None  Hair  Reviewed  Eyes  Reviewed  Mouth  Reviewed   Skin  Reviewed  Nails  Reviewed       Diet Order:  Diet Heart Room service appropriate? Yes; Fluid consistency: Thin  EDUCATION NEEDS:   Not appropriate for education at this time  Skin:  Skin Assessment: Reviewed RN Assessment  Last BM:  10/30/2017  Height:   Ht Readings from Last 1 Encounters:  10/31/17 5\' 2"  (1.575 m)    Weight:   Wt Readings from Last 1 Encounters:  10/31/17 115 lb (52.2 kg)    Ideal Body Weight:  50 kg  BMI:  Body mass index is 21.03 kg/m.  Estimated Nutritional Needs:   Kcal:  1300-1550 calories  Protein:  73-84 grams (1.4-1.6g/kg)  Fluid:  >1.5L   14/04/18. Anothy Bufano, MS, RD LDN Inpatient Clinical Dietitian Pager [phone]

## 2017-11-02 NOTE — Progress Notes (Signed)
PROGRESS NOTE    Alexandria Coffey  VEL:[id number] DOB: 06-18-1951 DOA: 10/31/2017 PCP: Fleet Contras, MD  Brief Narrative:66 y.o.female,w dm2, hypertension, hyperlipidemia, coccaine, abuse, PVD s/p PTA and atherectomy 03/29/2016 apparently c/odyspnea for the past week. Worse today. Slight cough w yellow sputum. Slight substernal chest pain. No radiation. Denies fever, chills, palp, n/v, diarrhea, brbpr. Pt presented to ED due to dyspnea and cough, and chest pain .  Patient was hypoxic in the emergency room her saturation was 70 on room air.  CT of the chest showed  There is no evidence of acute pulmonary thromboembolism.  Pulmonary artery remains dilated worrisome for elevated right heart pressure.  There is abnormal mediastinal and upper abdominal adenopathy. There may be bilateral hilar adenopathy. Malignancy is not excluded.  Fibrotic changes throughout the right lung have progressed  Confluent opacity at the right apex measuring 1.3 cm. Initial follow-up by chest CT without contrast is recommended in 3 months to confirm persistence. This recommendation follows the consensus statement: Recommendations for the Management of Subsolid Pulmonary Nodules Detected at CT: A Statement from the Fleischner Society as published in Radiology 2013; 266:304-317.  Elevation of the left hemidiaphragm is associated with left basilar atelectasis versus airspace disease.  Small left pleural effusion.   Assessment & Plan:   Principal Problem:   CAP (community acquired pneumonia) Active Problems:   Diabetes mellitus with peripheral artery disease (HCC)   Hypoxia   Pneumonia  Hypoxia secondary to lung fibrosis versus pneumonia/right apical pulmonary nodule/COPD tobacco abuse-on rocephin/azithro. Cocaine abuse.on 3 lit of oxygen sat above 90%.appreciae pccm input.  V. Tach/pvc-patient had negative cardiac enzymes.  Potassium 4.5 I do not have a magnesium but I will go ahead and give  her a dose of mag sulfate.  Stat labs ordered.  Cardiology consult placed today.start beta blocker.  DM with hypoglycemia-DC Glucotrol DC metformin.  SSI while in hospital.  I will start her on D5 50 cc an hour for persistent hypoglycemia.  Will DC IVF once blood sugar is above 150.  History of htn now low to normal.  PAD- Continue pletal.history of angioplasty.  History of cocaine abuse-add uds.     DVT prophylaxis: Lovenox Code Status full code Family Communication: No family available Disposition Plan: TBD  Consultants: Cardiology, pulmonary Procedures: None Antimicrobials: Rocephin and azithromycin  Subjective: Very sleepy denies chest pain shortness of breath.   Objective: Patient has been very drowsy and sleepy noted low blood sugars which have been being treated.  Vitals:   11/01/17 1330 11/01/17 2101 11/02/17 0502 11/02/17 0836  BP: 128/62 117/60 97/67 (!) 97/57  Pulse: (!) 104 77 78   Resp:  18 18   Temp: 98.2 F (36.8 C)     TempSrc:      SpO2: 99% 100% 98%   Weight:      Height:        Intake/Output Summary (Last 24 hours) at 11/02/2017 1158 Last data filed at 11/02/2017 0323 Gross per 24 hour  Intake 470 ml  Output 300 ml  Net 170 ml   Filed Weights   10/31/17 1345 10/31/17 1855  Weight: 52.2 kg (115 lb) 52.2 kg (115 lb)    Examination:  General exam: Appears calm and comfortable  Respiratory system:rhonchi auscultation. Respiratory effort normal. Cardiovascular system: S1 & S2 heard, RRR. No JVD, murmurs, rubs, gallops or clicks. No pedal edema. Gastrointestinal system: Abdomen is nondistended, soft and nontender. No organomegaly or masses felt. Normal bowel sounds heard. Central nervous system:  Alert and oriented. No focal neurological deficits. Extremities: Symmetric 5 x 5 power. Skin: No rashes, lesions or ulcers     Data Reviewed: I have personally reviewed following labs and imaging studies  CBC: Recent Labs  Lab 10/31/17 1420  11/01/17 0422 11/01/17 1422  WBC 4.7 4.6 4.3  NEUTROABS 2.6  --  2.1  HGB 10.5* 10.0* 10.2*  HCT 32.2* 32.8* 34.2*  MCV 67.6* 71.6* 72.6*  PLT 295 261 263   Basic Metabolic Panel: Recent Labs  Lab 10/31/17 1420 11/01/17 1422  NA 134* 139  K 4.5 4.3  CL 97* 103  CO2 30 29  GLUCOSE 109* 122*  BUN 11 7  CREATININE 0.78 0.97  CALCIUM 8.3* 8.2*  MG  --  1.6*   GFR: Estimated Creatinine Clearance: 45.1 mL/min (by C-G formula based on SCr of 0.97 mg/dL). Liver Function Tests: Recent Labs  Lab 10/31/17 1420 11/01/17 1422  AST 17 20  ALT 10* 9*  ALKPHOS 105 103  BILITOT 0.7 0.3  PROT 7.4 6.8  ALBUMIN 2.7* 2.4*   No results for input(s): LIPASE, AMYLASE in the last 168 hours. No results for input(s): AMMONIA in the last 168 hours. Coagulation Profile: No results for input(s): INR, PROTIME in the last 168 hours. Cardiac Enzymes: Recent Labs  Lab 10/31/17 1420 10/31/17 2222 11/01/17 0422 11/01/17 0922  TROPONINI <0.03 <0.03 <0.03 <0.03   BNP (last 3 results) No results for input(s): PROBNP in the last 8760 hours. HbA1C: No results for input(s): HGBA1C in the last 72 hours. CBG: Recent Labs  Lab 11/01/17 2054 11/01/17 2205 11/01/17 2332 11/02/17 0111 11/02/17 0811  GLUCAP 34* 49* 81 85 142*   Lipid Profile: No results for input(s): CHOL, HDL, LDLCALC, TRIG, CHOLHDL, LDLDIRECT in the last 72 hours. Thyroid Function Tests: No results for input(s): TSH, T4TOTAL, FREET4, T3FREE, THYROIDAB in the last 72 hours. Anemia Panel: No results for input(s): VITAMINB12, FOLATE, FERRITIN, TIBC, IRON, RETICCTPCT in the last 72 hours. Sepsis Labs: Recent Labs  Lab 10/31/17 1442  LATICACIDVEN 1.66    Recent Results (from the past 240 hour(s))  Blood Culture (routine x 2)     Status: None (Preliminary result)   Collection Time: 10/31/17  2:20 PM  Result Value Ref Range Status   Specimen Description BLOOD BLOOD LEFT ARM  Final   Special Requests   Final    BOTTLES  DRAWN AEROBIC AND ANAEROBIC Blood Culture results may not be optimal due to an inadequate volume of blood received in culture bottles   Culture   Final    NO GROWTH 2 DAYS Performed at [facility], [address], [place], [place] [address]    Report Status PENDING  Incomplete  Blood Culture (routine x 2)     Status: None (Preliminary result)   Collection Time: 10/31/17  3:40 PM  Result Value Ref Range Status   Specimen Description BLOOD BLOOD LEFT WRIST  Final   Special Requests   Final    BOTTLES DRAWN AEROBIC AND ANAEROBIC Blood Culture adequate volume   Culture   Final    NO GROWTH 2 DAYS Performed at [facility], [address] [place], [place] [address]    Report Status PENDING  Incomplete  Urine culture     Status: Abnormal   Collection Time: 10/31/17  3:40 PM  Result Value Ref Range Status   Specimen Description URINE, CLEAN CATCH  Final   Special Requests NONE  Final   Culture (  A)  Final    <10,000 COLONIES/mL INSIGNIFICANT GROWTH Performed at [facility], [address] [place], [place] [address]    Report Status 11/01/2017 FINAL  Final         Radiology Studies: Dg Chest 2 View  Result Date: 10/31/2017 CLINICAL DATA:  Shortness of breath.  Chest pain . EXAM: CHEST  2 VIEW COMPARISON:  09/17/2013 .  12/08/2009.  CT 08/10/2012. FINDINGS: Prior median sternotomy. Surgical clips left upper chest. Stable cardiomegaly scratched it cardiomegaly. Mild increase interstitial markings noted bilaterally, the mild CHF cannot be excluded. Mild pneumonitis cannot be excluded. Underlying chronic interstitial lung disease is most likely present. Stable elevation left hemidiaphragm. IMPRESSION: 1. Prior median sternotomy. Cardiomegaly. Mild bilateral interstitial prominence. Mild CHF cannot be excluded. Mild pneumonitis cannot be excluded. 2.  Underlying chronic interstitial lung disease. Electronically Signed   By: Maisie Fus  Register   On: 10/31/2017 14:32   Ct  Angio Chest Pe W And/or Wo Contrast  Result Date: 10/31/2017 CLINICAL DATA:  Short of breath and chest pain EXAM: CT ANGIOGRAPHY CHEST WITH CONTRAST TECHNIQUE: Multidetector CT imaging of the chest was performed using the standard protocol during bolus administration of intravenous contrast. Multiplanar CT image reconstructions and MIPs were obtained to evaluate the vascular anatomy. CONTRAST:  ISOVUE-370 IOPAMIDOL (ISOVUE-370) INJECTION 76% COMPARISON:  08/10/2012 FINDINGS: Cardiovascular: There are no filling defects in the pulmonary arterial tree to suggest acute pulmonary thromboembolism. The main pulmonary artery is markedly dilated with a diameter of 3.8 cm. This is not significantly changed. The right atrium is dilated. There are atherosclerotic calcifications of the aortic arch. Mild coronary artery calcifications. Injection of contrast was performed in the left upper extremity. 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 Collateral venous structures reconstitute the right innominate vein and SVC. Mediastinum/Nodes: Abnormal prevascular enlarged lymph nodes are present. 1.9 cm node on image 26. 1.1 cm prevascular node on image 24. These are both larger than on the prior study. Prominent peribronchovascular soft tissues in the hilar regions is also present. Hilar adenopathy cannot be excluded. No pericardial effusion. Thyroid is heterogeneous. Lungs/Pleura: No pneumothorax. Small left pleural effusion. Irregular linear and fibrotic changes throughout the right lung have progressed. There is more confluent opacity at the right apex on image 17 measuring 1.3 cm. The left hemidiaphragm is markedly elevated and there is atelectasis versus airspace disease at the left lung base. Upper Abdomen: There is abnormal adenopathy in the gastrohepatic ligament. 12 mm short axis diameter gastrohepatic ligament node on image 74 is larger. Musculoskeletal: There are metallic fragments in an about the  medial left clavicle. There is deformity of the clavicle which has a chronic appearance. Stable thoracic spine. Review of the MIP images confirms the above findings. IMPRESSION: There is no evidence of acute pulmonary thromboembolism. Pulmonary artery remains dilated worrisome for elevated right heart pressure. There is abnormal mediastinal and upper abdominal adenopathy. There may be bilateral hilar adenopathy. Malignancy is not excluded. Fibrotic changes throughout the right lung have progressed Confluent opacity at the right apex measuring 1.3 cm. Initial follow-up by chest CT without contrast is recommended in 3 months to confirm persistence. This recommendation follows the consensus statement: Recommendations for the Management of Subsolid Pulmonary Nodules Detected at CT: A Statement from the Fleischner Society as published in Radiology 2013; 266:304-317. Elevation of the left hemidiaphragm is associated with left basilar atelectasis versus airspace disease. Small left pleural effusion. 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 Aortic Atherosclerosis (ICD10-I70.0).  Electronically Signed   By: Jolaine Click M.D.   On: 10/31/2017 15:32        Scheduled Meds: . amitriptyline  50 mg Oral QHS  . aspirin EC  81 mg Oral Daily  . cilostazol  50 mg Oral BID  . enoxaparin (LOVENOX) injection  40 mg Subcutaneous Q24H  . famotidine  10 mg Oral BID  . feeding supplement (ENSURE ENLIVE)  237 mL Oral BID BM  . Influenza vac split quadrivalent PF  0.5 mL Intramuscular Tomorrow-1000  . insulin aspart  0-5 Units Subcutaneous QHS  . insulin aspart  0-9 Units Subcutaneous TID WC  . metFORMIN  1,000 mg Oral BID WC  . metoprolol tartrate  25 mg Oral BID   Continuous Infusions: . azithromycin Stopped (11/01/17 1630)  . cefTRIAXone (ROCEPHIN)  IV Stopped (11/01/17 1719)     LOS: 2 days      Alwyn Ren, MD [facility]  If 7PM-7AM, please contact  night-coverage [email or web address] Password [email or web address] 11/02/2017, 11:58 AM

## 2017-11-02 NOTE — Progress Notes (Signed)
Subjective:  Feels better and no dyspnea. Admits to using cocaine for the past few days and day of admission.   Objective:  Vital Signs in the last 24 hours: Temp:  [98.2 F (36.8 C)] 98.2 F (36.8 C) (12/05 1330) Pulse Rate:  [77-104] 78 (12/06 0502) Resp:  [18] 18 (12/06 0502) BP: (97-128)/(57-67) 97/57 (12/06 0836) SpO2:  [98 %-100 %] 98 % (12/06 0502)  Intake/Output from previous day: 12/05 0701 - 12/06 0700 In: 470 [P.O.:120; IV Piggyback:350] Out: 300 [Urine:300]  Physical Exam:  General appearance: alert, cooperative, appears older than stated age and no distress Eyes: negative findings: lids and lashes normal Neck: no adenopathy, no carotid bruit, no JVD, supple, symmetrical, trachea midline and thyroid not enlarged, symmetric, no tenderness/mass/nodules Neck: JVP - normal, carotids 2+= without bruits Resp: Diffuse coarse leathery crackles bilateral. Chest wall: no tenderness Cardio: regular rate and rhythm, S1, S2 normal, no murmur, click, rub or gallop GI: soft, non-tender; bowel sounds normal; no masses,  no organomegaly Extremities: extremities normal, atraumatic, no cyanosis or edema    Lab Results: BMP Recent Labs    10/31/17 1420 11/01/17 1422  NA 134* 139  K 4.5 4.3  CL 97* 103  CO2 30 29  GLUCOSE 109* 122*  BUN 11 7  CREATININE 0.78 0.97  CALCIUM 8.3* 8.2*  GFRNONAA >60 60*  GFRAA >60 >60    CBC Recent Labs  Lab 11/01/17 1422  WBC 4.3  RBC 4.71  HGB 10.2*  HCT 34.2*  PLT 263  MCV 72.6*  MCH 21.7*  MCHC 29.8*  RDW 18.0*  LYMPHSABS 1.6  MONOABS 0.6  EOSABS 0.0  BASOSABS 0.0    HEMOGLOBIN A1C No results found for: HGBA1C, MPG  Cardiac Panel (last 3 results) Recent Labs    10/31/17 2222 11/01/17 0422 11/01/17 0922  TROPONINI <0.03 <0.03 <0.03    Hepatic Function Panel Recent Labs    10/31/17 1420 11/01/17 1422  PROT 7.4 6.8  ALBUMIN 2.7* 2.4*  AST 17 20  ALT 10* 9*  ALKPHOS 105 103  BILITOT 0.7 0.3     Imaging: Imaging results have been reviewed  Cardiac Studies:  EKG 11/01/2017: Sinus tachycardia at the rate of 101 bpm.  Diffuse nonspecific T abnormality.  Early R-wave progression in anterior leads, may suggest RVH. No significant change from previous.  Echocardiogram 03/01/2017: Normal LV systolic function, septal motion consistent with a branch block.  Right ventricle appears mildly enlarged.  Normal RV function.  Moderate pulmonarly hypertension.  Telemetry: Normal sinus rhythm  Scheduled Meds: . amitriptyline  50 mg Oral QHS  . aspirin EC  81 mg Oral Daily  . cilostazol  50 mg Oral BID  . enoxaparin (LOVENOX) injection  40 mg Subcutaneous Q24H  . famotidine  10 mg Oral BID  . feeding supplement (ENSURE ENLIVE)  237 mL Oral BID BM  . glipiZIDE  10 mg Oral BID WC  . Influenza vac split quadrivalent PF  0.5 mL Intramuscular Tomorrow-1000  . insulin aspart  0-5 Units Subcutaneous QHS  . insulin aspart  0-9 Units Subcutaneous TID WC  . metFORMIN  1,000 mg Oral BID WC  . metoprolol tartrate  25 mg Oral BID   Continuous Infusions: . azithromycin Stopped (11/01/17 1630)  . cefTRIAXone (ROCEPHIN)  IV Stopped (11/01/17 1719)   PRN Meds:.albuterol, guaiFENesin-dextromethorphan, HYDROcodone-acetaminophen, ondansetron (ZOFRAN) IV   Assessment/Plan:  1. Abnormal EKG, nonspecific ST abnormalities noted.  I have reviewed the telemetry rhythm strips, there is no ventricular tachycardia,  rare PVCs and mostly artifact. 2.    Musculoskeletal chest pain, negative cardiac markers, no change in EKG from prior EKG.  Do not suspect ACS. 3.  Acute respiratory distress, markedly improved since yesterday, saturations are greater than 95% on 2 L of oxygen.  Acute on chronic diastolic heart failure. 4.  Peripheral arterial disease with history of right SFA and 2017 with severe diffuse disease below the knee and left external iliac artery and left SFA angioplasty in 2016 and 2015 respectively.  Now  has recurrence of symptoms of claudication in both the legs, right worse than the left per patient's history. 5.  IPF 6. Substance use, history of chronic cocaine use, last use was on the day of admission 7.  Iron deficiency anemia, could be related to nutritional.  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  Suspect the heart failure was precipitated by cocaine use.  Do not suspect infectious process.  With regard to peripheral arterial disease, would not recommend any further evaluation at this point although her symptoms of claudication have recently worsened.  Please call if questions.  [name], M.D. 11/02/2017, 8:39 AM [facility] Pager: [phone] Office: [phone] If no answer: [phone]

## 2017-11-03 ENCOUNTER — Inpatient Hospital Stay (HOSPITAL_COMMUNITY): Payer: Medicare Other

## 2017-11-03 LAB — GLUCOSE, CAPILLARY
GLUCOSE-CAPILLARY: 110 mg/dL — AB (ref 65–99)
GLUCOSE-CAPILLARY: 158 mg/dL — AB (ref 65–99)
GLUCOSE-CAPILLARY: 120 mg/dL — AB (ref 65–99)
Glucose-Capillary: 119 mg/dL — ABNORMAL HIGH (ref 65–99)
Glucose-Capillary: 142 mg/dL — ABNORMAL HIGH (ref 65–99)
Glucose-Capillary: 144 mg/dL — ABNORMAL HIGH (ref 65–99)

## 2017-11-03 NOTE — Progress Notes (Signed)
PROGRESS NOTE    Alexandria Coffey  KGY:[id number] DOB: 1951/02/13 DOA: 10/31/2017 PCP: Fleet Contras, MD   Brief Narrative:66 y.o.female,w dm2, hypertension, hyperlipidemia, coccaine, abuse, PVD s/p PTA and atherectomy 03/29/2016 apparently c/odyspnea for the past week. Worse today. Slight cough w yellow sputum. Slight substernal chest pain. No radiation. Denies fever, chills, palp, n/v, diarrhea, brbpr. Pt presented to ED due to dyspnea and cough, and chest pain .Patient was hypoxic in the emergency room her saturation was 70 on room air. CT of the chest showed  There is no evidence of acute pulmonary thromboembolism.  Pulmonary artery remains dilated worrisome for elevated right heart pressure.  There is abnormal mediastinal and upper abdominal adenopathy. There may be bilateral hilar adenopathy. Malignancy is not excluded.  Fibrotic changes throughout the right lung have progressed  Confluent opacity at the right apex measuring 1.3 cm. Initial follow-up by chest CT without contrast is recommended in 3 months to confirm persistence. This recommendation follows the consensus statement: Recommendations for the Management of Subsolid Pulmonary Nodules Detected at CT: A Statement from the Fleischner Society as published in Radiology 2013; 266:304-317.  Elevation of the left hemidiaphragm is associated with left basilar atelectasis vs airspace disease.small left pleural effusion.    Assessment & Plan:   Principal Problem:   CAP (community acquired pneumonia) Active Problems:   Diabetes mellitus with peripheral artery disease (HCC)   Hypoxia   Pneumonia  Hypoxia secondary to lung fibrosis versus pneumonia/right apical pulmonary nodule/COPD tobacco abuse-on rocephin/azithro. Cocaine abuse.on 3 lit of oxygen sat above 90%.appreciae pccm input.   DM with hypoglycemia-DC Glucotrol DC metformin.  SSI while in hospital.increase D5 100 cc an hour for persistent  hypoglycemia.  Will DC IVF once blood sugar is above 150.  History of htn now low to normal.  PAD- Continue pletal.history of angioplasty.  History of cocaine abuse-add uds.    DVT prophylaxis lovenox Code Statusfull Family Communication:none Disposition Plan once hypoglycemia resolves plan dc if ok with pulm. Consultants:  pccm,card Procedures: none Antimicrobials: Rocephin/azithro Subjective: Feels better,breathing better  Objective:resting,able to wake her up,speaking in full sentences.follows commands Vitals:   11/03/17 0619 11/03/17 0910 11/03/17 1349 11/03/17 1351  BP: 110/70 (!) 121/59 100/75 100/75  Pulse:  83 80 80  Resp:   18 18  Temp:   98.6 F (37 C) 98.6 F (37 C)  TempSrc:   Oral Oral  SpO2:   100% 100%  Weight:      Height:        Intake/Output Summary (Last 24 hours) at 11/03/2017 1530 Last data filed at 11/03/2017 1500 Gross per 24 hour  Intake 3137.5 ml  Output 300 ml  Net 2837.5 ml   Filed Weights   10/31/17 1345 10/31/17 1855  Weight: 52.2 kg (115 lb) 52.2 kg (115 lb)    Examination:  General exam: Appears calm and comfortable  Respiratory system: rhonchi to auscultation. Respiratory effort normal. Cardiovascular system: S1 & S2 heard, RRR. No JVD, murmurs, rubs, gallops or clicks. No pedal edema. Gastrointestinal system: Abdomen is nondistended, soft and nontender. No organomegaly or masses felt. Normal bowel sounds heard. Central nervous system: Alert and oriented. No focal neurological deficits. Extremities: Symmetric 5 x 5 power. Skin: No rashes, lesions or ulcers    Data Reviewed: I have personally reviewed following labs and imaging studies  CBC: Recent Labs  Lab 10/31/17 1420 11/01/17 0422 11/01/17 1422  WBC 4.7 4.6 4.3  NEUTROABS 2.6  --  2.1  HGB 10.5* 10.0*  10.2*  HCT 32.2* 32.8* 34.2*  MCV 67.6* 71.6* 72.6*  PLT 295 261 263   Basic Metabolic Panel: Recent Labs  Lab 10/31/17 1420 11/01/17 1422  NA 134* 139   K 4.5 4.3  CL 97* 103  CO2 30 29  GLUCOSE 109* 122*  BUN 11 7  CREATININE 0.78 0.97  CALCIUM 8.3* 8.2*  MG  --  1.6*   GFR: Estimated Creatinine Clearance: 45.1 mL/min (by C-G formula based on SCr of 0.97 mg/dL). Liver Function Tests: Recent Labs  Lab 10/31/17 1420 11/01/17 1422  AST 17 20  ALT 10* 9*  ALKPHOS 105 103  BILITOT 0.7 0.3  PROT 7.4 6.8  ALBUMIN 2.7* 2.4*   No results for input(s): LIPASE, AMYLASE in the last 168 hours. No results for input(s): AMMONIA in the last 168 hours. Coagulation Profile: No results for input(s): INR, PROTIME in the last 168 hours. Cardiac Enzymes: Recent Labs  Lab 10/31/17 1420 10/31/17 2222 11/01/17 0422 11/01/17 0922  TROPONINI <0.03 <0.03 <0.03 <0.03   BNP (last 3 results) No results for input(s): PROBNP in the last 8760 hours. HbA1C: No results for input(s): HGBA1C in the last 72 hours. CBG: Recent Labs  Lab 11/03/17 0013 11/03/17 0154 11/03/17 0611 11/03/17 0752 11/03/17 1159  GLUCAP 119* 142* 144* 110* 158*   Lipid Profile: No results for input(s): CHOL, HDL, LDLCALC, TRIG, CHOLHDL, LDLDIRECT in the last 72 hours. Thyroid Function Tests: No results for input(s): TSH, T4TOTAL, FREET4, T3FREE, THYROIDAB in the last 72 hours. Anemia Panel: No results for input(s): VITAMINB12, FOLATE, FERRITIN, TIBC, IRON, RETICCTPCT in the last 72 hours. Sepsis Labs: Recent Labs  Lab 10/31/17 1442  LATICACIDVEN 1.66    Recent Results (from the past 240 hour(s))  Blood Culture (routine x 2)     Status: None (Preliminary result)   Collection Time: 10/31/17  2:20 PM  Result Value Ref Range Status   Specimen Description BLOOD BLOOD LEFT ARM  Final   Special Requests   Final    BOTTLES DRAWN AEROBIC AND ANAEROBIC Blood Culture results may not be optimal due to an inadequate volume of blood received in culture bottles   Culture   Final    NO GROWTH 3 DAYS Performed at [facility], [address], [place], [place]  [address]    Report Status PENDING  Incomplete  Blood Culture (routine x 2)     Status: None (Preliminary result)   Collection Time: 10/31/17  3:40 PM  Result Value Ref Range Status   Specimen Description BLOOD BLOOD LEFT WRIST  Final   Special Requests   Final    BOTTLES DRAWN AEROBIC AND ANAEROBIC Blood Culture adequate volume   Culture   Final    NO GROWTH 3 DAYS Performed at [facility], [address], [place], [place] [address]    Report Status PENDING  Incomplete  Urine culture     Status: Abnormal   Collection Time: 10/31/17  3:40 PM  Result Value Ref Range Status   Specimen Description URINE, CLEAN CATCH  Final   Special Requests NONE  Final   Culture (A)  Final    <10,000 COLONIES/mL INSIGNIFICANT GROWTH Performed at [facility], [address] [place], [place] [address]    Report Status 11/01/2017 FINAL  Final         Radiology Studies: Dg Chest Port 1 View  Result Date: 11/03/2017 CLINICAL DATA:  Pneumonia. EXAM: PORTABLE CHEST 1 VIEW COMPARISON:  Chest radiographs and CTA 10/31/2017 FINDINGS: Prior median sternotomy with cardiomegaly again noted. Interstitial densities bilaterally are similar to the prior radiographs and compatible with underlying chronic lung disease. There is prominent persistent elevation of the left hemidiaphragm with associated mild left basilar opacity. A small left pleural effusion remains. No pneumothorax is identified. Multiple retained metallic foreign bodies are noted at the level of the medial left clavicle with associated chronic clavicular deformity. IMPRESSION: 1. Similar appearance of the chest including left hemidiaphragm elevation and mild left basilar atelectasis or pneumonia. Small left pleural effusion. 2. Chronic interstitial lung disease. Electronically Signed   By: Sebastian Ache M.D.   On: 11/03/2017 08:40        Scheduled Meds: . amitriptyline  50 mg Oral QHS  . aspirin EC  81 mg Oral Daily  . cilostazol  50  mg Oral BID  . enoxaparin (LOVENOX) injection  40 mg Subcutaneous Q24H  . famotidine  10 mg Oral BID  . feeding supplement (ENSURE ENLIVE)  237 mL Oral BID BM  . Influenza vac split quadrivalent PF  0.5 mL Intramuscular Tomorrow-1000  . insulin aspart  0-5 Units Subcutaneous QHS  . insulin aspart  0-9 Units Subcutaneous TID WC  . metoprolol tartrate  25 mg Oral BID   Continuous Infusions: . azithromycin Stopped (11/02/17 1602)  . cefTRIAXone (ROCEPHIN)  IV 1 g (11/03/17 1427)  . dextrose 100 mL/hr at 11/03/17 1239     LOS: 3 days      Alwyn Ren, MD [facility]  If 7PM-7AM, please contact night-coverage [email or web address] Password [email or web address] 11/03/2017, 3:30 PM

## 2017-11-03 NOTE — Progress Notes (Signed)
Hypoglycemic Event  CBG: 45 @ 2128  Treatment: 2 containers of apple juice and two packets of graham crackers Symptoms: none Follow-up CBG: Time: 2225 CBG Result: 113 Possible Reasons for Event: Poor PO intake Comments/MD notified: Pt is already on a D5 drip @ 100/hr. Intervention was effective.      Laverta Baltimore

## 2017-11-04 ENCOUNTER — Inpatient Hospital Stay (HOSPITAL_COMMUNITY): Payer: Medicare Other

## 2017-11-04 LAB — GLUCOSE, CAPILLARY
GLUCOSE-CAPILLARY: 94 mg/dL (ref 65–99)
GLUCOSE-CAPILLARY: 119 mg/dL — AB (ref 65–99)
Glucose-Capillary: 150 mg/dL — ABNORMAL HIGH (ref 65–99)
Glucose-Capillary: 149 mg/dL — ABNORMAL HIGH (ref 65–99)
Glucose-Capillary: 129 mg/dL — ABNORMAL HIGH (ref 65–99)

## 2017-11-04 MED ORDER — HYDROMORPHONE HCL 1 MG/ML IJ SOLN: 1.0000 mg | INTRAMUSCULAR | Status: DC | PRN

## 2017-11-04 MED ORDER — LACTULOSE 10 GM/15ML PO SOLN
30.0000 g | Freq: Two times a day (BID) | ORAL | Status: AC
  Administered 2017-11-04: 30 g via ORAL
  Filled 2017-11-04 (×2): qty 45

## 2017-11-04 MED ORDER — AZITHROMYCIN 500 MG PO TABS
500.0000 mg | ORAL_TABLET | Freq: Every day | ORAL | Status: DC
  Administered 2017-11-04 – 2017-11-06 (×3): 500 mg via ORAL
  Filled 2017-11-04 (×4): qty 1

## 2017-11-04 MED ORDER — DEXTROSE 5 % IV SOLN
INTRAVENOUS | Status: DC
  Administered 2017-11-04 (×2): via INTRAVENOUS

## 2017-11-04 MED ORDER — BISACODYL 5 MG PO TBEC
10.0000 mg | DELAYED_RELEASE_TABLET | Freq: Every morning | ORAL | Status: DC
  Administered 2017-11-04 – 2017-11-07 (×3): 10 mg via ORAL
  Filled 2017-11-04 (×4): qty 2

## 2017-11-04 NOTE — Evaluation (Signed)
Physical Therapy Evaluation Patient Details Name: Alexandria Coffey MRN: [id number] DOB: Dec 26, 1950 Today's Date: 11/04/2017   History of Present Illness  Pt is a 66 y/o female admitted secondary to respiratory distress and dx with CAP. PMH including but not limited to PVD, HTN, DM and cocaine abuse.   Clinical Impression  Pt presented supine in bed with HOB elevated, awake and willing to participate in therapy session. Pt's daughter present throughout session as well. Prior to admission, pt reported that she was very independent and active. Pt lives with her daughter who works during the day and pt is at home alone during that time. Pt ambulated a short distance in the hallway without use of an AD with close min guard. Of note, pt ambulated on RA with SPO2 decreasing to as low as 85% with quick recovery to >94% with reapplication of O2 via [place]. Pt would continue to benefit from skilled physical therapy services at this time while admitted and after d/c to address the below listed limitations in order to improve overall safety and independence with functional mobility.     Follow Up Recommendations Home health PT;Supervision/Assistance - 24 hour    Equipment Recommendations  None recommended by PT    Recommendations for Other Services       Precautions / Restrictions Precautions Precautions: Fall Precaution Comments: monitor SPO2 Restrictions Weight Bearing Restrictions: No      Mobility  Bed Mobility Overal bed mobility: Needs Assistance Bed Mobility: Supine to Sit;Sit to Supine     Supine to sit: Min guard Sit to supine: Min guard   General bed mobility comments: increased time and effort, min guard for safety  Transfers Overall transfer level: Needs assistance Equipment used: None Transfers: Sit to/from Stand Sit to Stand: Min guard         General transfer comment: for safety  Ambulation/Gait Ambulation/Gait assistance: Min guard Ambulation Distance (Feet): 75  Feet Assistive device: None;1 person hand held assist Gait Pattern/deviations: Step-through pattern;Decreased step length - right;Decreased step length - left;Decreased stride length Gait velocity: decreased Gait velocity interpretation: Below normal speed for age/gender General Gait Details: mild instability with ambulation but no overt LOB, close min guard for safety as pt with reports of feeling light-headed  Stairs            Wheelchair Mobility    Modified Rankin (Stroke Patients Only)       Balance Overall balance assessment: Needs assistance Sitting-balance support: Feet supported Sitting balance-[name]: Fair     Standing balance support: During functional activity;No upper extremity supported Standing balance-[name]: Fair                               Pertinent Vitals/Pain Pain Assessment: No/denies pain    Home Living Family/patient expects to be discharged to:: Private residence Living Arrangements: Children Available Help at Discharge: Family;Available PRN/intermittently Type of Home: House Home Access: Level entry     Home Layout: One level Home Equipment: None      Prior Function Level of Independence: Independent               Hand Dominance        Extremity/Trunk Assessment   Upper Extremity Assessment Upper Extremity Assessment: Overall WFL for tasks assessed    Lower Extremity Assessment Lower Extremity Assessment: Overall WFL for tasks assessed       Communication   Communication: No difficulties  Cognition Arousal/Alertness:  Awake/alert Behavior During Therapy: WFL for tasks assessed/performed Overall Cognitive Status: Within Functional Limits for tasks assessed                                        General Comments      Exercises     Assessment/Plan    PT Assessment Patient needs continued PT services  PT Problem List Decreased strength;Decreased activity  tolerance;Decreased balance;Decreased mobility;Decreased coordination;Decreased knowledge of use of DME;Decreased safety awareness;Decreased knowledge of precautions;Cardiopulmonary status limiting activity       PT Treatment Interventions DME instruction;Stair training;Gait training;Functional mobility training;Therapeutic activities;Therapeutic exercise;Balance training;Neuromuscular re-education;Patient/family education    PT Goals (Current goals can be found in the Care Plan section)  Acute Rehab PT Goals Patient Stated Goal: feel better PT Goal Formulation: With patient/family Time For Goal Achievement: 11/18/17 Potential to Achieve Goals: Good    Frequency Min 3X/week   Barriers to discharge        Co-evaluation               AM-PAC PT "6 Clicks" Daily Activity  Outcome Measure Difficulty turning over in bed (including adjusting bedclothes, sheets and blankets)?: A Little Difficulty moving from lying on back to sitting on the side of the bed? : A Little Difficulty sitting down on and standing up from a chair with arms (e.g., wheelchair, bedside commode, etc,.)?: A Little Help needed moving to and from a bed to chair (including a wheelchair)?: A Little Help needed walking in hospital room?: A Little Help needed climbing 3-5 steps with a railing? : A Lot 6 Click Score: 17    End of Session Equipment Utilized During Treatment: Gait belt;Oxygen Activity Tolerance: Patient limited by fatigue Patient left: in bed;with call bell/phone within reach;with bed alarm set;with family/visitor present Nurse Communication: Mobility status PT Visit Diagnosis: Other abnormalities of gait and mobility (R26.89)    Time: 5188-4166 PT Time Calculation (min) (ACUTE ONLY): 23 min   Charges:   PT Evaluation $PT Eval Moderate Complexity: 1 Mod PT Treatments $Gait Training: 8-22 mins   PT G Codes:        Stokes, PT, DPT [phone]   Alexandria Coffey 11/04/2017, 11:19  AM

## 2017-11-04 NOTE — Progress Notes (Signed)
   pccm can sign off - > per Dr Jerolyn Center   Dr. Kalman Shan, M.D., [facility]C.P Pulmonary and Critical Care Medicine Staff Physician, [facility] Center Director - Interstitial Lung Disease  Program  Pulmonary Fibrosis [facility] at [facility] [place], [place], [address]  Pager: [phone], If no answer or between  15:00h - 7:00h: call [phone] Telephone: [phone]

## 2017-11-04 NOTE — Progress Notes (Addendum)
PROGRESS NOTE    Alexandria Coffey  UEK:[id number] DOB: 1951-09-23 DOA: 10/31/2017 PCP: Fleet Contras, MD  Brief Narrative: :66 y.o.female,w dm2, hypertension, hyperlipidemia, coccaine, abuse, PVD s/p PTA and atherectomy 03/29/2016 apparently c/odyspnea for the past week. Worse today. Slight cough w yellow sputum. Slight substernal chest pain. No radiation. Denies fever, chills, palp, n/v, diarrhea, brbpr. Pt presented to ED due to dyspnea and cough, and chest pain .Patient was hypoxic in the emergency room her saturation was 70 on room air. CT of the chest showed  There is no evidence of acute pulmonary thromboembolism.  Pulmonary artery remains dilated worrisome for elevated right heart pressure.  There is abnormal mediastinal and upper abdominal adenopathy. There may be bilateral hilar adenopathy. Malignancy is not excluded.  Fibrotic changes throughout the right lung have progressed  Confluent opacity at the right apex measuring 1.3 cm. Initial follow-up by chest CT without contrast is recommended in 3 months to confirm persistence. This recommendation follows the consensus statement: Recommendations for the Management of Subsolid Pulmonary Nodules Detected at CT: A Statement from the Fleischner Society as published in Radiology 2013; 266:304-317.  Elevation of the left hemidiaphragm is associated with left basilar atelectasis vs airspace disease.small left pleural effusion   Assessment & Plan:   Principal Problem:   CAP (community acquired pneumonia) Active Problems:   Diabetes mellitus with peripheral artery disease (HCC)   Hypoxia   Pneumonia  Hypoxia secondary to lung fibrosis versus pneumonia/right apical pulmonary nodule/COPD tobacco abuse-on rocephin/azithro. Cocaine abuse.on 3 lit of oxygen sat above 90%.appreciae pccm input.follow up ct scan of chest in 3 weeks to assess lung nodule.   DMwith hypoglycemia-DC Glucotrol DC metformin.SSI while in  hospital.continue d5.Marland Kitchen History of htn now low to normal.  PAD- Continue pletal.history of angioplasty.  History of cocaine abuse-add uds.??i dont see any urine drug screen send though it was orderd.     DVT prophylaxis:lovenox Code Status: full Family Communication: no family available  Disposition Plan: hope to dc by Monday  Consultants:  pccm Procedures:none Antimicrobials: azithro/rocephin Subjective:denies sob chest pain,nausea,vomiting   Objective:resting in bed.in nad.was able to wake her up easily Vitals:   11/03/17 1349 11/03/17 1351 11/03/17 2209 11/04/17 0558  BP: 100/75 100/75 127/62 106/62  Pulse: 80 80 86 79  Resp: 18 18 18 18   Temp: 98.6 F (37 C) 98.6 F (37 C) 98.3 F (36.8 C) 97.6 F (36.4 C)  TempSrc: Oral Oral Oral Oral  SpO2: 100% 100% 91% 96%  Weight:      Height:        Intake/Output Summary (Last 24 hours) at 11/04/2017 1058 Last data filed at 11/04/2017 1000 Gross per 24 hour  Intake 1355 ml  Output -  Net 1355 ml   Filed Weights   10/31/17 1345 10/31/17 1855  Weight: 52.2 kg (115 lb) 52.2 kg (115 lb)    Examination:  General exam: Appears calm and comfortable  Respiratory system: scattered rhonchi auscultation. Respiratory effort normal. Cardiovascular system: S1 & S2 heard, RRR. No JVD, murmurs, rubs, gallops or clicks. No pedal edema. Gastrointestinal system: Abdomen is nondistended, soft and nontender. No organomegaly or masses felt. Normal bowel sounds heard. Central nervous system: Alert and oriented. No focal neurological deficits. Extremities: Symmetric 5 x 5 power. Skin: No rashes, lesions or ulcers Psychiatry: Judgement and insight appear normal. Mood & affect appropriate.     Data Reviewed: I have personally reviewed following labs and imaging studies  CBC: Recent Labs  Lab 10/31/17 1420 11/01/17  0422 11/01/17 1422  WBC 4.7 4.6 4.3  NEUTROABS 2.6  --  2.1  HGB 10.5* 10.0* 10.2*  HCT 32.2* 32.8* 34.2*  MCV  67.6* 71.6* 72.6*  PLT 295 261 263   Basic Metabolic Panel: Recent Labs  Lab 10/31/17 1420 11/01/17 1422  NA 134* 139  K 4.5 4.3  CL 97* 103  CO2 30 29  GLUCOSE 109* 122*  BUN 11 7  CREATININE 0.78 0.97  CALCIUM 8.3* 8.2*  MG  --  1.6*   GFR: Estimated Creatinine Clearance: 45.1 mL/min (by C-G formula based on SCr of 0.97 mg/dL). Liver Function Tests: Recent Labs  Lab 10/31/17 1420 11/01/17 1422  AST 17 20  ALT 10* 9*  ALKPHOS 105 103  BILITOT 0.7 0.3  PROT 7.4 6.8  ALBUMIN 2.7* 2.4*   No results for input(s): LIPASE, AMYLASE in the last 168 hours. No results for input(s): AMMONIA in the last 168 hours. Coagulation Profile: No results for input(s): INR, PROTIME in the last 168 hours. Cardiac Enzymes: Recent Labs  Lab 10/31/17 1420 10/31/17 2222 11/01/17 0422 11/01/17 0922  TROPONINI <0.03 <0.03 <0.03 <0.03   BNP (last 3 results) No results for input(s): PROBNP in the last 8760 hours. HbA1C: No results for input(s): HGBA1C in the last 72 hours. CBG: Recent Labs  Lab 11/03/17 0752 11/03/17 1159 11/03/17 1659 11/03/17 2247 11/04/17 0807  GLUCAP 110* 158* 120* 94 150*   Lipid Profile: No results for input(s): CHOL, HDL, LDLCALC, TRIG, CHOLHDL, LDLDIRECT in the last 72 hours. Thyroid Function Tests: No results for input(s): TSH, T4TOTAL, FREET4, T3FREE, THYROIDAB in the last 72 hours. Anemia Panel: No results for input(s): VITAMINB12, FOLATE, FERRITIN, TIBC, IRON, RETICCTPCT in the last 72 hours. Sepsis Labs: Recent Labs  Lab 10/31/17 1442  LATICACIDVEN 1.66    Recent Results (from the past 240 hour(s))  Blood Culture (routine x 2)     Status: None (Preliminary result)   Collection Time: 10/31/17  2:20 PM  Result Value Ref Range Status   Specimen Description BLOOD BLOOD LEFT ARM  Final   Special Requests   Final    BOTTLES DRAWN AEROBIC AND ANAEROBIC Blood Culture results may not be optimal due to an inadequate volume of blood received in  culture bottles   Culture   Final    NO GROWTH 3 DAYS Performed at [facility], [address], [place], [place] [address]    Report Status PENDING  Incomplete  Blood Culture (routine x 2)     Status: None (Preliminary result)   Collection Time: 10/31/17  3:40 PM  Result Value Ref Range Status   Specimen Description BLOOD BLOOD LEFT WRIST  Final   Special Requests   Final    BOTTLES DRAWN AEROBIC AND ANAEROBIC Blood Culture adequate volume   Culture   Final    NO GROWTH 3 DAYS Performed at [facility], [address] [place], [place] [address]    Report Status PENDING  Incomplete  Urine culture     Status: Abnormal   Collection Time: 10/31/17  3:40 PM  Result Value Ref Range Status   Specimen Description URINE, CLEAN CATCH  Final   Special Requests NONE  Final   Culture (A)  Final    <10,000 COLONIES/mL INSIGNIFICANT GROWTH Performed at [facility], [address] [place], [place] [address]    Report Status 11/01/2017 FINAL  Final         Radiology Studies: Dg  Chest Port 1 View  Result Date: 11/03/2017 CLINICAL DATA:  Pneumonia. EXAM: PORTABLE CHEST 1 VIEW COMPARISON:  Chest radiographs and CTA 10/31/2017 FINDINGS: Prior median sternotomy with cardiomegaly again noted. Interstitial densities bilaterally are similar to the prior radiographs and compatible with underlying chronic lung disease. There is prominent persistent elevation of the left hemidiaphragm with associated mild left basilar opacity. A small left pleural effusion remains. No pneumothorax is identified. Multiple retained metallic foreign bodies are noted at the level of the medial left clavicle with associated chronic clavicular deformity. IMPRESSION: 1. Similar appearance of the chest including left hemidiaphragm elevation and mild left basilar atelectasis or pneumonia. Small left pleural effusion. 2. Chronic interstitial lung disease. Electronically Signed   By: Sebastian Ache M.D.   On:  11/03/2017 08:40        Scheduled Meds: . amitriptyline  50 mg Oral QHS  . aspirin EC  81 mg Oral Daily  . cilostazol  50 mg Oral BID  . enoxaparin (LOVENOX) injection  40 mg Subcutaneous Q24H  . famotidine  10 mg Oral BID  . feeding supplement (ENSURE ENLIVE)  237 mL Oral BID BM  . Influenza vac split quadrivalent PF  0.5 mL Intramuscular Tomorrow-1000  . insulin aspart  0-5 Units Subcutaneous QHS  . insulin aspart  0-9 Units Subcutaneous TID WC  . metoprolol tartrate  25 mg Oral BID   Continuous Infusions: . azithromycin Stopped (11/03/17 1714)  . cefTRIAXone (ROCEPHIN)  IV Stopped (11/03/17 1601)  . dextrose 100 mL/hr at 11/04/17 0422     LOS: 4 days      Alwyn Ren, MD [facility]  If 7PM-7AM, please contact night-coverage [email or web address] Password [email or web address] 11/04/2017, 10:58 AM

## 2017-11-05 ENCOUNTER — Inpatient Hospital Stay (HOSPITAL_COMMUNITY): Payer: Medicare Other

## 2017-11-05 LAB — COMPREHENSIVE METABOLIC PANEL
ALBUMIN: 2.4 g/dL — AB (ref 3.5–5.0)
ALK PHOS: 80 U/L (ref 38–126)
ALT: 8 U/L — AB (ref 14–54)
ANION GAP: 7 (ref 5–15)
AST: 21 U/L (ref 15–41)
BUN: 17 mg/dL (ref 6–20)
CHLORIDE: 96 mmol/L — AB (ref 101–111)
CO2: 25 mmol/L (ref 22–32)
Calcium: 8.1 mg/dL — ABNORMAL LOW (ref 8.9–10.3)
Creatinine, Ser: 0.96 mg/dL (ref 0.44–1.00)
GFR calc Af Amer: >60 mL/min (ref >60)
GFR calc non Af Amer: >60 mL/min (ref >60)
Glucose, Bld: 121 mg/dL — ABNORMAL HIGH (ref 65–99)
POTASSIUM: 5.5 mmol/L — AB (ref 3.5–5.1)
SODIUM: 128 mmol/L — AB (ref 135–145)
TOTAL PROTEIN: 6.5 g/dL (ref 6.5–8.1)
Total Bilirubin: 0.4 mg/dL (ref 0.3–1.2)

## 2017-11-05 LAB — CBC WITH DIFFERENTIAL/PLATELET
BASOS ABS: 0 10*3/uL (ref 0.0–0.1)
BASOS PCT: 0 %
EOS ABS: 0.1 10*3/uL (ref 0.0–0.7)
Eosinophils Relative: 2 %
HCT: 32.9 % — ABNORMAL LOW (ref 36.0–46.0)
HEMOGLOBIN: 9.9 g/dL — AB (ref 12.0–15.0)
LYMPHS PCT: 32 %
Lymphs Abs: 1.2 10*3/uL (ref 0.7–4.0)
MCH: 21.6 pg — AB (ref 26.0–34.0)
MCHC: 30.1 g/dL (ref 30.0–36.0)
MCV: 71.7 fL — ABNORMAL LOW (ref 78.0–100.0)
MONO ABS: 0.7 10*3/uL (ref 0.1–1.0)
Monocytes Relative: 18 %
NEUTROS PCT: 48 %
Neutro Abs: 1.9 10*3/uL (ref 1.7–7.7)
PLATELETS: 249 10*3/uL (ref 150–400)
RBC: 4.59 MIL/uL (ref 3.87–5.11)
RDW: 17.7 % — ABNORMAL HIGH (ref 11.5–15.5)
WBC: 3.9 10*3/uL — AB (ref 4.0–10.5)

## 2017-11-05 LAB — RAPID URINE DRUG SCREEN, HOSP PERFORMED
Amphetamines: NOT DETECTED
Barbiturates: NOT DETECTED
Benzodiazepines: NOT DETECTED
Cocaine: POSITIVE — AB
OPIATES: NOT DETECTED
TETRAHYDROCANNABINOL: NOT DETECTED

## 2017-11-05 LAB — BLOOD GAS, ARTERIAL
ACID-BASE EXCESS: 2.7 mmol/L — AB (ref 0.0–2.0)
BICARBONATE: 29 mmol/L — AB (ref 20.0–28.0)
DRAWN BY: 520761
O2 Content: 2.5 L/min
O2 SAT: 93.5 %
PATIENT TEMPERATURE: 98.6
PH ART: 7.272 — AB (ref 7.350–7.450)
pCO2 arterial: 65.1 mmHg (ref 32.0–48.0)
pO2, Arterial: 77.9 mmHg — ABNORMAL LOW (ref 83.0–108.0)

## 2017-11-05 LAB — STREP PNEUMONIAE URINARY ANTIGEN: STREP PNEUMO URINARY ANTIGEN: NEGATIVE

## 2017-11-05 LAB — GLUCOSE, CAPILLARY
GLUCOSE-CAPILLARY: 111 mg/dL — AB (ref 65–99)
GLUCOSE-CAPILLARY: 147 mg/dL — AB (ref 65–99)
GLUCOSE-CAPILLARY: 292 mg/dL — AB (ref 65–99)
GLUCOSE-CAPILLARY: 198 mg/dL — AB (ref 65–99)
Glucose-Capillary: 187 mg/dL — ABNORMAL HIGH (ref 65–99)

## 2017-11-05 LAB — CULTURE, BLOOD (ROUTINE X 2)
CULTURE: NO GROWTH
Culture: NO GROWTH
Special Requests: ADEQUATE

## 2017-11-05 LAB — AMMONIA: AMMONIA: 36 umol/L — AB (ref 9–35)

## 2017-11-05 MED ORDER — IPRATROPIUM-ALBUTEROL 0.5-2.5 (3) MG/3ML IN SOLN: 3.0000 mL | RESPIRATORY_TRACT | Status: DC | PRN

## 2017-11-05 MED ORDER — FUROSEMIDE 10 MG/ML IJ SOLN
40.0000 mg | Freq: Every day | INTRAMUSCULAR | Status: DC
  Administered 2017-11-05 – 2017-11-06 (×2): 40 mg via INTRAVENOUS
  Filled 2017-11-05 (×2): qty 4

## 2017-11-05 MED ORDER — NALOXONE HCL 0.4 MG/ML IJ SOLN
0.4000 mg | INTRAMUSCULAR | Status: DC | PRN
  Administered 2017-11-05: 0.4 mg via INTRAVENOUS
  Filled 2017-11-05 (×2): qty 1

## 2017-11-05 MED ORDER — ACETAMINOPHEN 325 MG PO TABS
650.0000 mg | ORAL_TABLET | Freq: Four times a day (QID) | ORAL | Status: DC | PRN
  Administered 2017-11-05: 650 mg via ORAL
  Filled 2017-11-05: qty 2

## 2017-11-05 MED ORDER — METHYLPREDNISOLONE SODIUM SUCC 40 MG IJ SOLR
40.0000 mg | INTRAMUSCULAR | Status: DC
  Administered 2017-11-05 – 2017-11-07 (×3): 40 mg via INTRAVENOUS
  Filled 2017-11-05 (×3): qty 1

## 2017-11-05 MED ORDER — POTASSIUM CHLORIDE CRYS ER 20 MEQ PO TBCR: 20.0000 meq | EXTENDED_RELEASE_TABLET | Freq: Two times a day (BID) | ORAL | Status: DC

## 2017-11-05 MED ORDER — IPRATROPIUM-ALBUTEROL 0.5-2.5 (3) MG/3ML IN SOLN
3.0000 mL | RESPIRATORY_TRACT | Status: DC
  Administered 2017-11-05 – 2017-11-06 (×6): 3 mL via RESPIRATORY_TRACT
  Filled 2017-11-05 (×6): qty 3

## 2017-11-05 NOTE — Progress Notes (Addendum)
PROGRESS NOTE    Alexandria Coffey  IHK:[id number] DOB: [birth year] DOA: 10/31/2017 PCP: Fleet Contras, MD   Brief Narrative:66 y.o.female,w dm2, hypertension, hyperlipidemia, coccaine, abuse, PVD s/p PTA and atherectomy 03/29/2016 apparently c/odyspnea for the past week. Worse today. Slight cough w yellow sputum. Slight substernal chest pain. No radiation. Denies fever, chills, palp, n/v, diarrhea, brbpr. Pt presented to ED due to dyspnea and cough, and chest pain .Patient was hypoxic in the emergency room her saturation was 70 on room air. CT of the chest showed  There is no evidence of acute pulmonary thromboembolism.  Pulmonary artery remains dilated worrisome for elevated right heart pressure.  There is abnormal mediastinal and upper abdominal adenopathy. There may be bilateral hilar adenopathy. Malignancy is not excluded.  Fibrotic changes throughout the right lung have progressed  Confluent opacity at the right apex measuring 1.3 cm. Initial follow-up by chest CT without contrast is recommended in 3 months to confirm persistence. This recommendation follows the consensus statement: Recommendations for the Management of Subsolid Pulmonary Nodules Detected at CT: A Statement from the Fleischner Society as published in Radiology 2013; 266:304-317.  Elevation of the left hemidiaphragm is associated with left basilar atelectasis vs airspace disease.small left pleural effusion.  11/05/2017 events from last night noted rapid response was called as patient was more confused.  She was given Narcan and an ABG was done which showed increased PCO2 she was placed on BiPAP and moved to SD U.  Chest x-ray that was done overnight shows possible fluid overload.  Patient reports that she was on Lasix at one point in the past.  Daughter is by the bedside updated the patient and the daughter.  Discussed about use of cocaine and patient is promising that she will not go back to use of drugs  anymore.  Daughter is very supportive of this.    Assessment & Plan:   Principal Problem:   CAP (community acquired pneumonia) Active Problems:   Diabetes mellitus with peripheral artery disease (HCC)   Hypoxia   Pneumonia  Hypoxia secondary to lung fibrosis versus pneumonia/right apical pulmonary nodule/COPD tobacco abuse-on rocephin/azithro. on 3 lit of oxygen sat above 90%.appreciae pccm input.  Chest x-ray shows may be pleural effusion and some fluid overload we will DC D5 and start Lasix 40 mg daily and titrate the doses as needed.  Hyponatremia-new.?siadh.starting lasix will help.follow levels.  Hyperkalemia-follow levels.no kayexalate as lasix is being started.   DMwith hypoglycemia-DC Glucotrol DC metformin.SSI while in hospital.DC D5.monitor blood sugar closely.   History of htn now low to normal.  PAD- Continue pletal.history of angioplasty.  History of cocaine abuse-add uds.     DVT prophylaxis:lovenox Code Status: full Family Communication:none Disposition Plan: tbd Consultants:  pccm,card Procedures:none Antimicrobials:  Rocephin/azithro Subjective: Feels better..  Objective: Vitals:   11/05/17 0419 11/05/17 0458 11/05/17 0749 11/05/17 0855  BP:  (!) 143/76  104/78  Pulse:  87  96  Resp:  16  20  Temp:  98.2 F (36.8 C)  98.7 F (37.1 C)  TempSrc:  Axillary  Oral  SpO2:  96% 97% 94%  Weight: 59.5 kg (131 lb 2.8 oz)     Height:        Intake/Output Summary (Last 24 hours) at 11/05/2017 1227 Last data filed at 11/04/2017 1800 Gross per 24 hour  Intake 247.5 ml  Output -  Net 247.5 ml   Filed Weights   10/31/17 1345 10/31/17 1855 11/05/17 0419  Weight: 52.2 kg (115 lb) 52.2  kg (115 lb) 59.5 kg (131 lb 2.8 oz)    Examination:  General exam: Appears calm and comfortable  Respiratory system: Crackles right base to auscultation. Respiratory effort normal. Cardiovascular system: S1 & S2 heard, RRR. No JVD, murmurs, rubs, gallops or  clicks. No pedal edema. Gastrointestinal system: Abdomen is nondistended, soft and nontender. No organomegaly or masses felt. Normal bowel sounds heard. Central nervous system: Alert and oriented. No focal neurological deficits. Extremities: Symmetric 5 x 5 power. Skin: No rashes, lesions or ulcers   Data Reviewed: I have personally reviewed following labs and imaging studies  CBC: Recent Labs  Lab 10/31/17 1420 11/01/17 0422 11/01/17 1422 11/05/17 0254  WBC 4.7 4.6 4.3 3.9*  NEUTROABS 2.6  --  2.1 1.9  HGB 10.5* 10.0* 10.2* 9.9*  HCT 32.2* 32.8* 34.2* 32.9*  MCV 67.6* 71.6* 72.6* 71.7*  PLT 295 261 263 249   Basic Metabolic Panel: Recent Labs  Lab 10/31/17 1420 11/01/17 1422 11/05/17 0254  NA 134* 139 128*  K 4.5 4.3 5.5*  CL 97* 103 96*  CO2 30 29 25   GLUCOSE 109* 122* 121*  BUN 11 7 17   CREATININE 0.78 0.97 0.96  CALCIUM 8.3* 8.2* 8.1*  MG  --  1.6*  --    GFR: Estimated Creatinine Clearance: 45.6 mL/min (by C-G formula based on SCr of 0.96 mg/dL). Liver Function Tests: Recent Labs  Lab 10/31/17 1420 11/01/17 1422 11/05/17 0254  AST 17 20 21   ALT 10* 9* 8*  ALKPHOS 105 103 80  BILITOT 0.7 0.3 0.4  PROT 7.4 6.8 6.5  ALBUMIN 2.7* 2.4* 2.4*   No results for input(s): LIPASE, AMYLASE in the last 168 hours. Recent Labs  Lab 11/05/17 0254  AMMONIA 36*   Coagulation Profile: No results for input(s): INR, PROTIME in the last 168 hours. Cardiac Enzymes: Recent Labs  Lab 10/31/17 1420 10/31/17 2222 11/01/17 0422 11/01/17 0922  TROPONINI <0.03 <0.03 <0.03 <0.03   BNP (last 3 results) No results for input(s): PROBNP in the last 8760 hours. HbA1C: No results for input(s): HGBA1C in the last 72 hours. CBG: Recent Labs  Lab 11/04/17 1652 11/04/17 2231 11/05/17 0210 11/05/17 0608 11/05/17 1140  GLUCAP 129* 119* 111* 147* 292*   Lipid Profile: No results for input(s): CHOL, HDL, LDLCALC, TRIG, CHOLHDL, LDLDIRECT in the last 72 hours. Thyroid  Function Tests: No results for input(s): TSH, T4TOTAL, FREET4, T3FREE, THYROIDAB in the last 72 hours. Anemia Panel: No results for input(s): VITAMINB12, FOLATE, FERRITIN, TIBC, IRON, RETICCTPCT in the last 72 hours. Sepsis Labs: Recent Labs  Lab 10/31/17 1442  LATICACIDVEN 1.66    Recent Results (from the past 240 hour(s))  Blood Culture (routine x 2)     Status: None (Preliminary result)   Collection Time: 10/31/17  2:20 PM  Result Value Ref Range Status   Specimen Description BLOOD BLOOD LEFT ARM  Final   Special Requests   Final    BOTTLES DRAWN AEROBIC AND ANAEROBIC Blood Culture results may not be optimal due to an inadequate volume of blood received in culture bottles   Culture   Final    NO GROWTH 4 DAYS Performed at [facility], [address], [place], [place] [address]    Report Status PENDING  Incomplete  Blood Culture (routine x 2)     Status: None (Preliminary result)   Collection Time: 10/31/17  3:40 PM  Result Value Ref Range Status   Specimen Description BLOOD BLOOD LEFT WRIST  Final   Special Requests   Final    BOTTLES DRAWN AEROBIC AND ANAEROBIC Blood Culture adequate volume   Culture   Final    NO GROWTH 4 DAYS Performed at [facility], [address], [place], [place] [address]    Report Status PENDING  Incomplete  Urine culture     Status: Abnormal   Collection Time: 10/31/17  3:40 PM  Result Value Ref Range Status   Specimen Description URINE, CLEAN CATCH  Final   Special Requests NONE  Final   Culture (A)  Final    <10,000 COLONIES/mL INSIGNIFICANT GROWTH Performed at [facility], [address], [place], [place] [address]    Report Status 11/01/2017 FINAL  Final         Radiology Studies: Ct Head Wo Contrast  Result Date: 11/05/2017 CLINICAL DATA:  66 y/o F; unexplained altered level of consciousness. EXAM: CT HEAD WITHOUT CONTRAST TECHNIQUE: Contiguous axial images were obtained from the base of the skull through the  vertex without intravenous contrast. COMPARISON:  None. FINDINGS: Brain: No evidence of acute infarction, hemorrhage, hydrocephalus, extra-axial collection or mass lesion/mass effect. Mild chronic microvascular ischemic changes and parenchymal volume loss of the brain. Vascular: Calcific atherosclerosis of carotid siphons. No hyperdense vessel. Skull: Normal. Negative for fracture or focal lesion. Sinuses/Orbits: Mild left maxillary sinus mucosal thickening. Bilateral intra-ocular lens replacement. Otherwise negative. Other: None. IMPRESSION: 1. No acute intracranial abnormality identified. 2. Mild chronic microvascular ischemic changes and parenchymal volume loss of the brain. Electronically Signed   By: Mitzi Hansen M.D.   On: 11/05/2017 04:12   Dg Chest Port 1 View  Result Date: 11/05/2017 CLINICAL DATA:  Respiratory distress. EXAM: PORTABLE CHEST 1 VIEW COMPARISON:  November 04, 2017 FINDINGS: No pneumothorax. Elevation left hemidiaphragm is again identified. There is opacity in left base with an associated effusion, unchanged since November 04, 2017. Increased interstitial markings in the lungs, right greater than left suggest edema. No other acute abnormalities. IMPRESSION: Persistent asymmetric edema, right greater than left. Persistent elevation of the left hemidiaphragm with a left-sided effusion and underlying opacity. Electronically Signed   By: Gerome Sam III M.D   On: 11/05/2017 03:22   Dg Chest Port 1 View  Result Date: 11/04/2017 CLINICAL DATA:  Shortness of breath for 1 week. EXAM: PORTABLE CHEST 1 VIEW COMPARISON:  One-view chest x-ray 11/03/2017. FINDINGS: The heart is enlarged. Chronic elevation of left hemidiaphragm is stable. Diffuse interstitial and airspace disease is similar the prior exam. Bilateral effusions are noted. Pleural density at the right apex is stable. Surgical clips are stable. IMPRESSION: 1. Similar appearance of diffuse interstitial and airspace disease  bilaterally representing a combination of chronic disease with likely some superimposed edema. 2. Bilateral pleural effusions.  Consider congestive heart failure. 3. Chronic elevation of the left hemidiaphragm. Electronically Signed   By: Marin Roberts M.D.   On: 11/04/2017 13:25        Scheduled Meds: . aspirin EC  81 mg Oral Daily  . azithromycin  500 mg Oral Daily  . bisacodyl  10 mg Oral q morning - 10a  . cilostazol  50 mg Oral BID  . enoxaparin (LOVENOX) injection  40 mg Subcutaneous Q24H  . famotidine  10 mg Oral BID  . feeding supplement (ENSURE ENLIVE)  237 mL Oral BID BM  . furosemide  40 mg Intravenous Daily  . Influenza vac split quadrivalent PF  0.5 mL Intramuscular Tomorrow-1000  . insulin aspart  0-5 Units Subcutaneous QHS  . insulin aspart  0-9 Units Subcutaneous TID WC  . ipratropium-albuterol  3 mL Nebulization Q4H  . methylPREDNISolone (SOLU-MEDROL) injection  40 mg Intravenous Q24H  . metoprolol tartrate  25 mg Oral BID   Continuous Infusions: . cefTRIAXone (ROCEPHIN)  IV Stopped (11/04/17 1605)  . dextrose Stopped (11/05/17 1050)     LOS: 5 days       Alwyn Ren, MD [facility]  If 7PM-7AM, please contact night-coverage [email or web address] Password [email or web address] 11/05/2017, 12:27 PM

## 2017-11-05 NOTE — Progress Notes (Signed)
Pt removed nasal cannula and oxygen saturation dropped to 86%. 2 liters O2 placed back on pt. Oxygen saturation came back up to 96%. Will continue to monitor.   Berdine Dance BSN, RN

## 2017-11-05 NOTE — Significant Event (Signed)
Rapid Response Event Note  Overview: Time Called: 0205 Arrival Time: 0207 Event Type: Neurologic  Initial Focused Assessment: Decrease LOC   Interventions:ABG, Bipap and Narcan.   Plan of Care (if not transferred):  Event Summary: Called to assist with care of patient with a change in level of consciousness. Reported that patient was able to ambulate earlier during the shift. Now patient is lethargic and difficult to keep awake. Skin is warm and dry. Heart sounds regular. Lungs with faint wheezes but diminished in bases. Breathing is non-labored. Abd is soft and distend. No c/o pain. MAE X4. PERRL. An ABG was ordered due to level of consciousness. Dr. Jaci Standard came to bedside. ABG results were noted and patient was placed on the Bipap. Patient now awaiting transfer to a SDU.   Alexandria Coffey

## 2017-11-06 ENCOUNTER — Inpatient Hospital Stay (HOSPITAL_COMMUNITY): Payer: Medicare Other

## 2017-11-06 DIAGNOSIS — R609 Edema, unspecified: Secondary | ICD-10-CM

## 2017-11-06 LAB — BASIC METABOLIC PANEL
ANION GAP: 5 (ref 5–15)
BUN: 15 mg/dL (ref 6–20)
CO2: 32 mmol/L (ref 22–32)
CREATININE: 0.86 mg/dL (ref 0.44–1.00)
Calcium: 8.3 mg/dL — ABNORMAL LOW (ref 8.9–10.3)
Chloride: 100 mmol/L — ABNORMAL LOW (ref 101–111)
Glucose, Bld: 120 mg/dL — ABNORMAL HIGH (ref 65–99)
Potassium: 4.2 mmol/L (ref 3.5–5.1)
Sodium: 137 mmol/L (ref 135–145)

## 2017-11-06 LAB — BLOOD GAS, ARTERIAL
Acid-Base Excess: 3.1 mmol/L — ABNORMAL HIGH (ref 0.0–2.0)
Bicarbonate: 28.4 mmol/L — ABNORMAL HIGH (ref 20.0–28.0)
Delivery systems: POSITIVE
Drawn by: 52078
Expiratory PAP: 6
FIO2: 0.25
Inspiratory PAP: 12
O2 Saturation: 90.5 %
Patient temperature: 98.6
pCO2 arterial: 53.8 mmHg — ABNORMAL HIGH (ref 32.0–48.0)
pH, Arterial: 7.342 — ABNORMAL LOW (ref 7.350–7.450)
pO2, Arterial: 61.7 mmHg — ABNORMAL LOW (ref 83.0–108.0)

## 2017-11-06 LAB — GLUCOSE, CAPILLARY
Glucose-Capillary: 150 mg/dL — ABNORMAL HIGH (ref 65–99)
Glucose-Capillary: 166 mg/dL — ABNORMAL HIGH (ref 65–99)
Glucose-Capillary: 223 mg/dL — ABNORMAL HIGH (ref 65–99)
Glucose-Capillary: 108 mg/dL — ABNORMAL HIGH (ref 65–99)

## 2017-11-06 LAB — LEGIONELLA PNEUMOPHILA SEROGP 1 UR AG: L. PNEUMOPHILA SEROGP 1 UR AG: NEGATIVE

## 2017-11-06 MED ORDER — FUROSEMIDE 10 MG/ML IJ SOLN
40.0000 mg | Freq: Two times a day (BID) | INTRAMUSCULAR | Status: DC
  Administered 2017-11-06 – 2017-11-07 (×2): 40 mg via INTRAVENOUS
  Filled 2017-11-06 (×2): qty 4

## 2017-11-06 MED ORDER — IPRATROPIUM-ALBUTEROL 0.5-2.5 (3) MG/3ML IN SOLN
3.0000 mL | Freq: Four times a day (QID) | RESPIRATORY_TRACT | Status: DC
  Administered 2017-11-06 – 2017-11-07 (×6): 3 mL via RESPIRATORY_TRACT
  Filled 2017-11-06 (×7): qty 3

## 2017-11-06 NOTE — Progress Notes (Signed)
PROGRESS NOTE    Alexandria Coffey  JYN:[id number] DOB: [birth year] DOA: 10/31/2017 PCP: Fleet Contras, MD  Brief Narrative:66 y.o.female,w dm2, hypertension, hyperlipidemia, coccaine, abuse, PVD s/p PTA and atherectomy 03/29/2016 apparently c/odyspnea for the past week. Worse today. Slight cough w yellow sputum. Slight substernal chest pain. No radiation. Denies fever, chills, palp, n/v, diarrhea, brbpr. Pt presented to ED due to dyspnea and cough, and chest pain .Patient was hypoxic in the emergency room her saturation was 70 on room air. CT of the chest showed  There is no evidence of acute pulmonary thromboembolism.  Pulmonary artery remains dilated worrisome for elevated right heart pressure.  There is abnormal mediastinal and upper abdominal adenopathy. There may be bilateral hilar adenopathy. Malignancy is not excluded.  Fibrotic changes throughout the right lung have progressed  Confluent opacity at the right apex measuring 1.3 cm. Initial follow-up by chest CT without contrast is recommended in 3 months confirm persistence. This recommendation follows the consensus statement: Recommendations for the Management of Subsolid Pulmonary Nodules Detected at CT: A Statement from the Fleischner Society as published in Radiology 2013; 266:304-317.  Elevation of the left hemidiaphragm is associated with left basilaratelectasis vs airspace disease.small left pleural effusion.  11/05/2017 events from last night noted rapid response was called as patient was more confused.  She was given Narcan and an ABG was done which showed increased PCO2 she was placed on BiPAP and moved to SD U.  Chest x-ray that was done overnight shows possible fluid overload.  Patient reports that she was on Lasix at one point in the past.  Daughter is by the bedside updated the patient and the daughter.  Discussed about use of cocaine and patient is promising that she will not go back to use of drugs anymore.   Daughter is very supportive of this.  11/06/17 patient had a good night.  Complaining of left upper and lower extremity swelling which is new she has a peripheral IV in her left upper extremity.  She feels feels her breathing is better however she gets very hypoxic when taken off the oxygen.       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  Patient is currently being treated with IV antibiotics.  Patient was also found to have right apical pulmonary nodule with a history of tobacco use.  Patient will be followed at the pulmonary as an outpatient.  Follow-up chest x-ray will be done today.  She has crackles in both lung bases.  At the time of admission to the hospital patient had pleural effusion and she received IV fluids for hypoglycemia.  So Lasix was started yesterday.  Patient was also found to be hypercapnic which was treated with BiPAP.  Hypoglycemia with a history of diabetes and oral hypoglycemic agents on board.  Glucotrol and metformin has been stopped and currently being treated with SSI she was also being treated with D5 which was stopped yesterday.  New onset swelling of left upper and lower extremity do venous Doppler of both upper and lower extremity to rule out DVT.  Hyponatremia resolved   DVT prophylaxis: Lovenox Code Status: Full codeFamily Communication: Discussed in detail with patient and her daughter. Disposition Plan:.  Plan will be to discharge her tomorrow with home home health PT and OT and home oxygen.  Consultants: Cardiology, pulmonary  Procedures none Antimicrobials Rocephin and azithromycin Subjective: Feels better she feels her cough is better breathing is better.  Objective: Sitting by the side of the bed in no acute distress  daughter by the bedside Vitals:   11/06/17 0018 11/06/17 0318 11/06/17 0810 11/06/17 0820  BP:  133/65  (!) 122/98  Pulse: 95 92    Resp: 17 14  18   Temp:  97.8 F (36.6 C)    TempSrc:  Oral    SpO2: 98% 100% 100% 100%  Weight:      Height:        Intake/Output Summary (Last 24 hours) at 11/06/2017 1158 Last data filed at 11/06/2017 0500 Gross per 24 hour  Intake 250 ml  Output -  Net 250 ml   Filed Weights   10/31/17 1345 10/31/17 1855 11/05/17 0419  Weight: 52.2 kg (115 lb) 52.2 kg (115 lb) 59.5 kg (131 lb 2.8 oz)    Examination:  General exam: Appears calm and comfortable  Respiratory systemcrackles b/l bases auscultation. Respiratory effort normal. Cardiovascular system: S1 & S2 heard, RRR. No JVD, murmurs, rubs, gallops or clicks. No pedal edema. Gastrointestinal system: Abdomen is nondistended, soft and nontender. No organomegaly or masses felt. Normal bowel sounds heard. Central nervous system: Alert and oriented. No focal neurological deficits. Extremities:edema 1 plus LUE AND LLE Skin: No rashes, lesions or ulcers Psychiatry: Judgement and insight appear normal. Mood & affect appropriate.     Data Reviewed: I have personally reviewed following labs and imaging studies  CBC: Recent Labs  Lab 10/31/17 1420 11/01/17 0422 11/01/17 1422 11/05/17 0254  WBC 4.7 4.6 4.3 3.9*  NEUTROABS 2.6  --  2.1 1.9  HGB 10.5* 10.0* 10.2* 9.9*  HCT 32.2* 32.8* 34.2* 32.9*  MCV 67.6* 71.6* 72.6* 71.7*  PLT 295 261 263 249   Basic Metabolic Panel: Recent Labs  Lab 10/31/17 1420 11/01/17 1422 11/05/17 0254 11/06/17 0318  NA 134* 139 128* 137  K 4.5 4.3 5.5* 4.2  CL 97* 103 96* 100*  CO2 30 29 25  32  GLUCOSE 109* 122* 121* 120*  BUN 11 7 17 15   CREATININE 0.78 0.97 0.96 0.86  CALCIUM 8.3* 8.2* 8.1* 8.3*  MG  --  1.6*  --   --    GFR: Estimated Creatinine Clearance: 50.9 mL/min (by C-G formula based on SCr of 0.86 mg/dL). Liver Function Tests: Recent Labs    Lab 10/31/17 1420 11/01/17 1422 11/05/17 0254  AST 17 20 21   ALT 10* 9* 8*  ALKPHOS 105 103 80  BILITOT 0.7 0.3 0.4  PROT 7.4 6.8 6.5  ALBUMIN 2.7* 2.4* 2.4*   No results for input(s): LIPASE, AMYLASE in the last 168 hours. Recent Labs  Lab 11/05/17 0254  AMMONIA 36*   Coagulation Profile: No results for input(s): INR, PROTIME in the last 168 hours. Cardiac Enzymes: Recent Labs  Lab 10/31/17 1420 10/31/17 2222 11/01/17 0422 11/01/17 0922  TROPONINI <0.03 <0.03 <0.03 <0.03   BNP (last 3 results) No results for input(s): PROBNP in the last 8760 hours. HbA1C: No results for input(s): HGBA1C in the last 72 hours. CBG: Recent Labs  Lab 11/05/17 1140 11/05/17 1632 11/05/17 2143 11/06/17 0610 11/06/17 1128  GLUCAP 292* 198* 187* 150* 166*   Lipid Profile: No results for input(s): CHOL, HDL, LDLCALC, TRIG, CHOLHDL, LDLDIRECT in the last 72 hours. Thyroid Function Tests: No results for input(s): TSH, T4TOTAL, FREET4, T3FREE, THYROIDAB in the last 72 hours. Anemia Panel: No  results for input(s): VITAMINB12, FOLATE, FERRITIN, TIBC, IRON, RETICCTPCT in the last 72 hours. Sepsis Labs: Recent Labs  Lab 10/31/17 1442  LATICACIDVEN 1.66    Recent Results (from the past 240 hour(s))  Blood Culture (routine x 2)     Status: None   Collection Time: 10/31/17  2:20 PM  Result Value Ref Range Status   Specimen Description BLOOD BLOOD LEFT ARM  Final   Special Requests   Final    BOTTLES DRAWN AEROBIC AND ANAEROBIC Blood Culture results may not be optimal due to an inadequate volume of blood received in culture bottles   Culture   Final    NO GROWTH 5 DAYS Performed at [facility], [address], [place], [place] [address]    Report Status 11/05/2017 FINAL  Final  Blood Culture (routine x 2)     Status: None   Collection Time: 10/31/17  3:40 PM  Result Value Ref Range Status   Specimen Description BLOOD BLOOD LEFT WRIST  Final   Special Requests   Final     BOTTLES DRAWN AEROBIC AND ANAEROBIC Blood Culture adequate volume   Culture   Final    NO GROWTH 5 DAYS Performed at [facility], [address] [place], [place] [address]    Report Status 11/05/2017 FINAL  Final  Urine culture     Status: Abnormal   Collection Time: 10/31/17  3:40 PM  Result Value Ref Range Status   Specimen Description URINE, CLEAN CATCH  Final   Special Requests NONE  Final   Culture (A)  Final    <10,000 COLONIES/mL INSIGNIFICANT GROWTH Performed at [facility], [address], [place], [place] [address]    Report Status 11/01/2017 FINAL  Final         Radiology Studies: Dg Chest 1 View  Result Date: 11/06/2017 CLINICAL DATA:  Hypoxia. EXAM: CHEST 1 VIEW COMPARISON:  November 05, 2017 FINDINGS: The heart size and mediastinal contours are stable. The heart size is enlarged. The few increased pulmonary interstitium is identified bilaterally slightly improved in the left lung base. The previously noted consolidation of left lung base is improved. Minimal left pleural effusion is identified. The visualized skeletal structures are unremarkable. IMPRESSION: Persistent edema, slightly improved. Previously noted consolidation of left lung base is improved with a small left side pleural effusion. Electronically Signed   By: Sherian Rein M.D.   On: 11/06/2017 10:53   Ct Head Wo Contrast  Result Date: 11/05/2017 CLINICAL DATA:  66 y/o F; unexplained altered level of consciousness. EXAM: CT HEAD WITHOUT CONTRAST TECHNIQUE: Contiguous axial images were obtained from the base of the skull through the vertex without intravenous contrast. COMPARISON:  None. FINDINGS: Brain: No evidence of acute infarction, hemorrhage, hydrocephalus, extra-axial collection or mass lesion/mass effect. Mild chronic microvascular ischemic changes and parenchymal volume loss of the brain. Vascular: Calcific atherosclerosis of carotid siphons. No hyperdense vessel. Skull: Normal. Negative  for fracture or focal lesion. Sinuses/Orbits: Mild left maxillary sinus mucosal thickening. Bilateral intra-ocular lens replacement. Otherwise negative. Other: None. IMPRESSION: 1. No acute intracranial abnormality identified. 2. Mild chronic microvascular ischemic changes and parenchymal volume loss of the brain. Electronically Signed   By: Mitzi Hansen M.D.   On: 11/05/2017 04:12   Dg Chest Port 1 View  Result Date: 11/05/2017 CLINICAL DATA:  Respiratory distress. EXAM: PORTABLE CHEST 1 VIEW COMPARISON:  November 04, 2017 FINDINGS: No pneumothorax. Elevation left hemidiaphragm is again identified. There is opacity  in left base with an associated effusion, unchanged since November 04, 2017. Increased interstitial markings in the lungs, right greater than left suggest edema. No other acute abnormalities. IMPRESSION: Persistent asymmetric edema, right greater than left. Persistent elevation of the left hemidiaphragm with a left-sided effusion and underlying opacity. Electronically Signed   By: Gerome Sam III M.D   On: 11/05/2017 03:22        Scheduled Meds: . aspirin EC  81 mg Oral Daily  . azithromycin  500 mg Oral Daily  . bisacodyl  10 mg Oral q morning - 10a  . cilostazol  50 mg Oral BID  . enoxaparin (LOVENOX) injection  40 mg Subcutaneous Q24H  . famotidine  10 mg Oral BID  . feeding supplement (ENSURE ENLIVE)  237 mL Oral BID BM  . furosemide  40 mg Intravenous Daily  . Influenza vac split quadrivalent PF  0.5 mL Intramuscular Tomorrow-1000  . insulin aspart  0-5 Units Subcutaneous QHS  . insulin aspart  0-9 Units Subcutaneous TID WC  . ipratropium-albuterol  3 mL Nebulization QID  . methylPREDNISolone (SOLU-MEDROL) injection  40 mg Intravenous Q24H  . metoprolol tartrate  25 mg Oral BID   Continuous Infusions: . cefTRIAXone (ROCEPHIN)  IV Stopped (11/05/17 1532)  . dextrose Stopped (11/05/17 1050)     LOS: 6 days      Alwyn Ren, MD [facility]  HospitalistIf 7PM-7AM, please contact night-coverage [email or web address] Password [email or web address] 11/06/2017, 11:58 AM

## 2017-11-06 NOTE — Progress Notes (Signed)
VASCULAR LAB PRELIMINARY  PRELIMINARY  PRELIMINARY  PRELIMINARY  Left upper extremity venous duplex completed.    Preliminary report:  There is no DVT or SVT noted in the left upper extremity.  Juanita Devincent, RVT 11/06/2017, 1:30 PM

## 2017-11-06 NOTE — Progress Notes (Signed)
VASCULAR LAB PRELIMINARY  PRELIMINARY  PRELIMINARY  PRELIMINARY  Left lower extremity venous duplex completed.    Preliminary report:  There is no DVT or SVT noted in the left lower extremity. Waveforms are pulsatile suggestive of fluid overload.  Avner Stroder, RVT 11/06/2017, 1:28 PM

## 2017-11-06 NOTE — Progress Notes (Signed)
PT Cancellation Note  Patient Details Name: Alexandria Coffey MRN: [id number] DOB: [birth year]   Cancelled Treatment:    Reason Eval/Treat Not Completed: Other (comment).  Pt was off the floor when PT arrived and will try again as time and pt allow.   Ivar Drape 11/06/2017, 3:42 PM   Samul Dada, PT MS Acute Rehab Dept. Number: [facility] [id number] and [facility] [phone]

## 2017-11-07 LAB — GLUCOSE, CAPILLARY
GLUCOSE-CAPILLARY: 163 mg/dL — AB (ref 65–99)
GLUCOSE-CAPILLARY: 97 mg/dL (ref 65–99)
Glucose-Capillary: 361 mg/dL — ABNORMAL HIGH (ref 65–99)

## 2017-11-07 LAB — CREATININE, SERUM
CREATININE: 0.94 mg/dL (ref 0.44–1.00)
GFR calc non Af Amer: >60 mL/min (ref >60)

## 2017-11-07 MED ORDER — IPRATROPIUM-ALBUTEROL 0.5-2.5 (3) MG/3ML IN SOLN: 3.0000 mL | RESPIRATORY_TRACT | 1 refills | Status: AC | PRN

## 2017-11-07 MED ORDER — HYDROXYZINE HCL 10 MG PO TABS: 10.0000 mg | ORAL_TABLET | Freq: Four times a day (QID) | ORAL | 0 refills | Status: DC | PRN

## 2017-11-07 MED ORDER — METOPROLOL TARTRATE 25 MG PO TABS: 25.0000 mg | ORAL_TABLET | Freq: Two times a day (BID) | ORAL | 0 refills | Status: DC

## 2017-11-07 MED ORDER — ASPIRIN 81 MG PO TBEC
81.0000 mg | DELAYED_RELEASE_TABLET | Freq: Every day | ORAL | Status: AC
Start: 2017-11-08 — End: ?

## 2017-11-07 MED ORDER — GUAIFENESIN-DM 100-10 MG/5ML PO SYRP: 5.0000 mL | ORAL_SOLUTION | ORAL | 0 refills | Status: DC | PRN

## 2017-11-07 MED ORDER — GLIPIZIDE ER 10 MG PO TB24: 10.0000 mg | ORAL_TABLET | Freq: Every day | ORAL | 0 refills | Status: AC

## 2017-11-07 NOTE — Progress Notes (Signed)
SATURATION QUALIFICATIONS: (This note is used to comply with regulatory documentation for home oxygen)  Patient Saturations on Room Air at Rest = 89%  Patient Saturations on Room Air while Ambulating = 85%  Patient Saturations on 2 Liters of oxygen while Ambulating = 94%  Please briefly explain why patient needs home oxygen: Pt oxygen saturation does not stay above 90% on rest or while walking on room air.

## 2017-11-07 NOTE — Care Management Note (Addendum)
Case Management Note Donn Pierini RN, BSN Unit 4E-Case Manager [phone]  Patient Details  Name: Alexandria Coffey MRN: [id number] Date of Birth: 06-16-1951  Subjective/Objective:   Pt admitted with CAP                 Action/Plan: PTA pt lived at home alone- per conversation with pt she plans to go to stay with her daughter Alexandria Coffey- at discharge- address- [address], [place] [address]- phone- [phone] Pt will need home 02 and HH - orders placed- choice offered for [facility] agencies per pt she would like to use [facility] for DME and HH needs- referral called to Lupita Leash with [facility] for home 02, nebulizer, and shower chair, HHPT/aide- portable tank to be delivered to room  Prior to discharge-   Expected Discharge Date:  11/07/17               Expected Discharge Plan:  Home w Home Health Services  In-House Referral:  NA  Discharge planning Services  CM Consult  Post Acute Care Choice:  Durable Medical Equipment, Home Health Choice offered to:  Patient  DME Arranged:  Oxygen, Shower stool, Nebulizer machine DME Agency:  [facility]  HH Arranged:  PT, Nurse's Aide, RN, OT [facility] Agency:  [facility]  Status of Service:  Completed, signed off  If discussed at Long Length of Stay Meetings, dates discussed:    Discharge Disposition: home/home health   Additional Comments:  Darrold Span, RN 11/07/2017, 3:31 PM

## 2017-11-07 NOTE — Discharge Summary (Signed)
Physician Discharge Summary  Alexandria Coffey:[id number] DOB: [birth year] DOA: 10/31/2017  PCP: Fleet Contras, MD  Admit date: 10/31/2017 Discharge date: 11/07/2017  Admitted From HOME : Disposition:  HOME  Recommendations for Outpatient Follow-up:  1. Follow up with PCP in 1-2 weeks 2. Please obtain BMP/CBC in one week  Home Health YES :Equipment/Devices OXYGEN 2 LIT [place] Discharge Condition:STABLE CODE STATUS:FULL Diet recommendation:CARDIAC Brief/Interim Summary:66 y.o.female,w dm2, hypertension, hyperlipidemia, coccaine, abuse, PVD s/p PTA and atherectomy 03/29/2016 apparently c/odyspnea for the past week. Worse today. Slight cough w yellow sputum. Slight substernal chest pain. No radiation. Denies fever, chills, palp, n/v, diarrhea, brbpr. Pt presented to ED due to dyspnea and cough, and chest pain .Patient was hypoxic in the emergency room her saturation was 70 on room air. CT of the chest showed  There is no evidence of acute pulmonary thromboembolism.  Pulmonary artery remains dilated worrisome for elevated right heart pressure.  There is abnormal mediastinal and upper abdominal adenopathy. There may be bilateral hilar adenopathy. Malignancy is not excluded.  Fibrotic changes throughout the right lung have progressed  Confluent opacity at the right apex measuring 1.3 cm. Initial follow-up by chest CT without contrast is recommended in 3 months confirm persistence. This recommendation follows the consensus statement: Recommendations for the Management of Subsolid Pulmonary Nodules Detected at CT: A Statement from the Fleischner Society as published in Radiology 2013; 266:304-317.  Elevation of the left hemidiaphragm is associated with left basilaratelectasis vs airspace disease.small left pleural effusion. 11/05/2017 events from last night noted rapid response was called as patient was more confused. She was given Narcan and an ABG was done which showed  increased PCO2 she was placed on BiPAP and moved to SD U. Chest x-ray that was done overnight shows possible fluid overload. Patient reports that she was on Lasix at one point in the past. Daughter is by the bedside updated the patient and the daughter. Discussed about use of cocaine and patient is promising that she will not go back to use of drugs anymore. Daughter is very supportive of this.  11/06/17 patient had a good night.  Complaining of left upper and lower extremity swelling which is new she has a peripheral IV in her left upper extremity.  She feels feels her breathing is better however she gets very hypoxic when taken off the oxygen.      Discharge Diagnoses:  Principal Problem:   CAP (community acquired pneumonia) Active Problems:   Diabetes mellitus with peripheral artery disease (HCC)   Hypoxia   Pneumonia  Hypoxia/HYPERCAPNEIA secondary to idiopathic pulmonary fibrosis/COPD plus pneumonia.  Patient is currently being treated with IV antibiotics.  Patient was also found to have right apical pulmonary nodule with a history of tobacco use.  Patient will be followed at the pulmonary as an outpatient.  Patient Saturations on Room Air at Rest = 89%Patient Saturations on [employer] while Ambulating = 85%  Patient Saturations on 2 Liters of oxygen while Ambulating = 94%  Please briefly explain why patient needs home oxygen: Pt oxygen saturation does not stay above 90% on rest or while walking on room air.     Patient Saturations on Room Air while Ambulating = 85%  Patient Saturations on 2 Liters of oxygen while Ambulating = 94%  Please briefly explain why patient needs home oxygen: Pt oxygen saturation does not stay above 90% on rest or while walking on room air.    DM- RESTART GLUCOTROL  New onset swelling of left upper  and lower extremity do venous Doppler of both upper and lower extremity to rule out DVT.  Hyponatremia resolved     Discharge  Instructions  Discharge Instructions    Consult to care management   Complete by:  As directed    DME Nebulizer/meds   Complete by:  As directed    Patient needs a nebulizer to treat with the following condition:  Idiopathic pulmonary fibrosis (HCC)   Face-to-face encounter (required for Medicare/Medicaid patients)   Complete by:  As directed    I Alwyn Ren certify that this patient is under my care and that I, or a nurse practitioner or physician's assistant working with me, had a face-to-face encounter that meets the physician face-to-face encounter requirements with this patient on 11/07/2017. The encounter with the patient was in whole, or in part for the following medical condition(s) which is the primary reason for home health care (List medical condition):IPF,DM,CAD   The encounter with the patient was in whole, or in part, for the following medical condition, which is the primary reason for home health care:  IPF,DM   I certify that, based on my findings, the following services are medically necessary home health services:  Physical therapy   Reason for Medically Necessary Home Health Services:  Therapy- Investment banker, operational, Patent examiner   My clinical findings support the need for the above services:  Shortness of breath with activity   Further, I certify that my clinical findings support that this patient is homebound due to:  Shortness of Breath with activity   For home use only DME oxygen   Complete by:  As directed    Mode or (Route):  Nasal cannula   Liters per Minute:  2   Frequency:  Continuous (stationary and portable oxygen unit needed)   Oxygen conserving device:  No   Oxygen delivery system:  Gas   Home Health   Complete by:  As directed    To provide the following care/treatments:  PT   Shower chair   Complete by:  As directed    Shower chair   Complete by:  As directed      Allergies as of 11/07/2017      Reactions   Lipitor  [atorvastatin] Other (See Comments)   Muscle pain      Medication List    STOP taking these medications   metFORMIN 1000 MG tablet Commonly known as:  GLUCOPHAGE     TAKE these medications   albuterol 108 (90 Base) MCG/ACT inhaler Commonly known as:  PROVENTIL HFA;VENTOLIN HFA Inhale 2 puffs into the lungs every 6 (six) hours as needed for wheezing or shortness of breath.   amitriptyline 50 MG tablet Commonly known as:  ELAVIL Take 50 mg by mouth at bedtime.   amLODipine-benazepril 5-20 MG capsule Commonly known as:  LOTREL Take 1 capsule by mouth daily.   aspirin 81 MG EC tablet Take 1 tablet (81 mg total) by mouth daily. Start taking on:  11/08/2017   BESIVANCE 0.6 % Susp Generic drug:  Besifloxacin HCl Place 1 drop into the right eye 2 (two) times daily.   cilostazol 50 MG tablet Commonly known as:  PLETAL Take 50 mg by mouth 2 (two) times daily.   DUREZOL 0.05 % Emul Generic drug:  Difluprednate Place 1 drop into the left eye 4 (four) times daily.   glipiZIDE 10 MG 24 hr tablet Commonly known as:  GLUCOTROL XL Take 1 tablet (10 mg total)  by mouth daily. What changed:  when to take this   guaiFENesin-dextromethorphan 100-10 MG/5ML syrup Commonly known as:  ROBITUSSIN DM Take 5 mLs by mouth every 4 (four) hours as needed for cough.   hydrOXYzine 10 MG tablet Commonly known as:  ATARAX/VISTARIL Take 1 tablet (10 mg total) by mouth every 6 (six) hours as needed (For itiching).   ipratropium-albuterol 0.5-2.5 (3) MG/3ML Soln Commonly known as:  DUONEB Take 3 mLs by nebulization every 2 (two) hours as needed.   metoprolol tartrate 25 MG tablet Commonly known as:  LOPRESSOR Take 1 tablet (25 mg total) by mouth 2 (two) times daily.   PROLENSA 0.07 % Soln Generic drug:  Bromfenac Sodium Place 1 drop into the left eye 2 (two) times daily. As directed   ranitidine 150 MG tablet Commonly known as:  ZANTAC Take 150 mg by mouth 2 (two) times daily.             Durable Medical Equipment  (From admission, onward)        Start     Ordered   11/07/17 1448  For home use only DME oxygen  Once    Question Answer Comment  Mode or (Route) Nasal cannula   Liters per Minute 2   Frequency Continuous (stationary and portable oxygen unit needed)   Oxygen conserving device No   Oxygen delivery system Gas      11/07/17 1447   11/07/17 0000  DME Nebulizer/meds    Question:  Patient needs a nebulizer to treat with the following condition  Answer:  Idiopathic pulmonary fibrosis (HCC)   11/07/17 1429   11/07/17 0000  For home use only DME oxygen    Question Answer Comment  Mode or (Route) Nasal cannula   Liters per Minute 2   Frequency Continuous (stationary and portable oxygen unit needed)   Oxygen conserving device No   Oxygen delivery system Gas      11/07/17 1447     Follow-up Information    Fleet Contras, MD Follow up.   Specialty:  Internal Medicine Contact information: [address] [place] [address] [phone]        [name], MD Follow up.   Specialty:  Cardiology Contact information: [address] [place] [address] [phone]        Chilton Greathouse, MD Follow up.   Specialty:  Pulmonary Disease Contact information: [address] [place] [address] [phone]          Allergies  Allergen Reactions  . Lipitor [Atorvastatin] Other (See Comments)    Muscle pain     Consultations: CARDIO DR Candis Schatz Procedures/Studies: Dg Chest 1 View  Result Date: 11/06/2017 CLINICAL DATA:  Hypoxia. EXAM: CHEST 1 VIEW COMPARISON:  November 05, 2017 FINDINGS: The heart size and mediastinal contours are stable. The heart size is enlarged. The few increased pulmonary interstitium is identified bilaterally slightly improved in the left lung base. The previously noted consolidation of left lung base is improved. Minimal left pleural effusion is identified. The visualized skeletal  structures are unremarkable. IMPRESSION: Persistent edema, slightly improved. Previously noted consolidation of left lung base is improved with a small left side pleural effusion. Electronically Signed   By: Sherian Rein M.D.   On: 11/06/2017 10:53   Dg Chest 2 View  Result Date: 10/31/2017 CLINICAL DATA:  Shortness of breath.  Chest pain . EXAM: CHEST  2 VIEW COMPARISON:  09/17/2013 .  12/08/2009.  CT 08/10/2012.  FINDINGS: Prior median sternotomy. Surgical clips left upper chest. Stable cardiomegaly scratched it cardiomegaly. Mild increase interstitial markings noted bilaterally, the mild CHF cannot be excluded. Mild pneumonitis cannot be excluded. Underlying chronic interstitial lung disease is most likely present. Stable elevation left hemidiaphragm. IMPRESSION: 1. Prior median sternotomy. Cardiomegaly. Mild bilateral interstitial prominence. Mild CHF cannot be excluded. Mild pneumonitis cannot be excluded. 2.  Underlying chronic interstitial lung disease. Electronically Signed   By: Maisie Fus  Register   On: 10/31/2017 14:32   Ct Head Wo Contrast  Result Date: 11/05/2017 CLINICAL DATA:  66 y/o F; unexplained altered level of consciousness. EXAM: CT HEAD WITHOUT CONTRAST TECHNIQUE: Contiguous axial images were obtained from the base of the skull through the vertex without intravenous contrast. COMPARISON:  None. FINDINGS: Brain: No evidence of acute infarction, hemorrhage, hydrocephalus, extra-axial collection or mass lesion/mass effect. Mild chronic microvascular ischemic changes and parenchymal volume loss of the brain. Vascular: Calcific atherosclerosis of carotid siphons. No hyperdense vessel. Skull: Normal. Negative for fracture or focal lesion. Sinuses/Orbits: Mild left maxillary sinus mucosal thickening. Bilateral intra-ocular lens replacement. Otherwise negative. Other: None. IMPRESSION: 1. No acute intracranial abnormality identified. 2. Mild chronic microvascular ischemic changes and parenchymal  volume loss of the brain. Electronically Signed   By: Mitzi Hansen M.D.   On: 11/05/2017 04:12   Ct Angio Chest Pe W And/or Wo Contrast  Result Date: 10/31/2017 CLINICAL DATA:  Short of breath and chest pain EXAM: CT ANGIOGRAPHY CHEST WITH CONTRAST TECHNIQUE: Multidetector CT imaging of the chest was performed using the standard protocol during bolus administration of intravenous contrast. Multiplanar CT image reconstructions and MIPs were obtained to evaluate the vascular anatomy. CONTRAST:  ISOVUE-370 IOPAMIDOL (ISOVUE-370) INJECTION 76% COMPARISON:  08/10/2012 FINDINGS: Cardiovascular: There are no filling defects in the pulmonary arterial tree to suggest acute pulmonary thromboembolism. The main pulmonary artery is markedly dilated with a diameter of 3.8 cm. This is not significantly changed. The right atrium is dilated. There are atherosclerotic calcifications of the aortic arch. Mild coronary artery calcifications. Injection of contrast was performed in the left upper extremity. 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 Collateral venous structures reconstitute the right innominate vein and SVC. Mediastinum/Nodes: Abnormal prevascular enlarged lymph nodes are present. 1.9 cm node on image 26. 1.1 cm prevascular node on image 24. These are both larger than on the prior study. Prominent peribronchovascular soft tissues in the hilar regions is also present. Hilar adenopathy cannot be excluded. No pericardial effusion. Thyroid is heterogeneous. Lungs/Pleura: No pneumothorax. Small left pleural effusion. Irregular linear and fibrotic changes throughout the right lung have progressed. There is more confluent opacity at the right apex on image 17 measuring 1.3 cm. The left hemidiaphragm is markedly elevated and there is atelectasis versus airspace disease at the left lung base. Upper Abdomen: There is abnormal adenopathy in the gastrohepatic ligament. 12 mm short axis diameter  gastrohepatic ligament node on image 74 is larger. Musculoskeletal: There are metallic fragments in an about the medial left clavicle. There is deformity of the clavicle which has a chronic appearance. Stable thoracic spine. Review of the MIP images confirms the above findings. IMPRESSION: There is no evidence of acute pulmonary thromboembolism. Pulmonary artery remains dilated worrisome for elevated right heart pressure. There is abnormal mediastinal and upper abdominal adenopathy. There may be bilateral hilar adenopathy. Malignancy is not excluded. Fibrotic changes throughout the right lung have progressed Confluent opacity at the right apex measuring 1.3 cm. Initial follow-up by chest CT without  contrast is recommended in 3 months to confirm persistence. This recommendation follows the consensus statement: Recommendations for the Management of Subsolid Pulmonary Nodules Detected at CT: A Statement from the Fleischner Society as published in Radiology 2013; 266:304-317. Elevation of the left hemidiaphragm is associated with left basilar atelectasis versus airspace disease. Small left pleural effusion. 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 Aortic Atherosclerosis (ICD10-I70.0). Electronically Signed   By: Jolaine ClickArthur  Hoss M.D.   On: 10/31/2017 15:32   Dg Chest Port 1 View  Result Date: 11/05/2017 CLINICAL DATA:  Respiratory distress. EXAM: PORTABLE CHEST 1 VIEW COMPARISON:  November 04, 2017 FINDINGS: No pneumothorax. Elevation left hemidiaphragm is again identified. There is opacity in left base with an associated effusion, unchanged since November 04, 2017. Increased interstitial markings in the lungs, right greater than left suggest edema. No other acute abnormalities. IMPRESSION: Persistent asymmetric edema, right greater than left. Persistent elevation of the left hemidiaphragm with a left-sided effusion and underlying opacity. Electronically Signed   By: Gerome Samavid  Williams III M.D   On: 11/05/2017 03:22    Dg Chest Port 1 View  Result Date: 11/04/2017 CLINICAL DATA:  Shortness of breath for 1 week. EXAM: PORTABLE CHEST 1 VIEW COMPARISON:  One-view chest x-ray 11/03/2017. FINDINGS: The heart is enlarged. Chronic elevation of left hemidiaphragm is stable. Diffuse interstitial and airspace disease is similar the prior exam. Bilateral effusions are noted. Pleural density at the right apex is stable. Surgical clips are stable. IMPRESSION: 1. Similar appearance of diffuse interstitial and airspace disease bilaterally representing a combination of chronic disease with likely some superimposed edema. 2. Bilateral pleural effusions.  Consider congestive heart failure. 3. Chronic elevation of the left hemidiaphragm. Electronically Signed   By: Marin Robertshristopher  Mattern M.D.   On: 11/04/2017 13:25   Dg Chest Port 1 View  Result Date: 11/03/2017 CLINICAL DATA:  Pneumonia. EXAM: PORTABLE CHEST 1 VIEW COMPARISON:  Chest radiographs and CTA 10/31/2017 FINDINGS: Prior median sternotomy with cardiomegaly again noted. Interstitial densities bilaterally are similar to the prior radiographs and compatible with underlying chronic lung disease. There is prominent persistent elevation of the left hemidiaphragm with associated mild left basilar opacity. A small left pleural effusion remains. No pneumothorax is identified. Multiple retained metallic foreign bodies are noted at the level of the medial left clavicle with associated chronic clavicular deformity. IMPRESSION: 1. Similar appearance of the chest including left hemidiaphragm elevation and mild left basilar atelectasis or pneumonia. Small left pleural effusion. 2. Chronic interstitial lung disease. Electronically Signed   By: Sebastian AcheAllen  Grady M.D.   On: 11/03/2017 08:40    (Echo, Carotid, EGD, Colonoscopy, ERCP)    Subjective:   Discharge Exam: Vitals:   11/07/17 1219 11/07/17 1411  BP:  97/66  Pulse:  (!) 101  Resp:  16  Temp:  98.1 F (36.7 C)  SpO2: 98% 100%    Vitals:   11/07/17 0825 11/07/17 0832 11/07/17 1219 11/07/17 1411  BP: (!) 125/92   97/66  Pulse: 95   (!) 101  Resp:    16  Temp:    98.1 F (36.7 C)  TempSrc:    Oral  SpO2:  97% 98% 100%  Weight:      Height:        General: Pt is alert, awake, not in acute distress Cardiovascular: RRR, S1/S2 +, no rubs, no gallops Respiratory: CTA bilaterally, no wheezing, no rhonchi Abdominal: Soft, NT, ND, bowel sounds + Extremities: no edema, no cyanosis    The results of  significant diagnostics from this hospitalization (including imaging, microbiology, ancillary and laboratory) are listed below for reference.     Microbiology: Recent Results (from the past 240 hour(s))  Blood Culture (routine x 2)     Status: None   Collection Time: 10/31/17  2:20 PM  Result Value Ref Range Status   Specimen Description BLOOD BLOOD LEFT ARM  Final   Special Requests   Final    BOTTLES DRAWN AEROBIC AND ANAEROBIC Blood Culture results may not be optimal due to an inadequate volume of blood received in culture bottles   Culture   Final    NO GROWTH 5 DAYS Performed at [facility], [address] [place], [place] [address]    Report Status 11/05/2017 FINAL  Final  Blood Culture (routine x 2)     Status: None   Collection Time: 10/31/17  3:40 PM  Result Value Ref Range Status   Specimen Description BLOOD BLOOD LEFT WRIST  Final   Special Requests   Final    BOTTLES DRAWN AEROBIC AND ANAEROBIC Blood Culture adequate volume   Culture   Final    NO GROWTH 5 DAYS Performed at [facility], [address] [place], [place] [address]    Report Status 11/05/2017 FINAL  Final  Urine culture     Status: Abnormal   Collection Time: 10/31/17  3:40 PM  Result Value Ref Range Status   Specimen Description URINE, CLEAN CATCH  Final   Special Requests NONE  Final   Culture (A)  Final    <10,000 COLONIES/mL INSIGNIFICANT GROWTH Performed at [facility], [address]  [place], [place] [address]    Report Status 11/01/2017 FINAL  Final     Labs: BNP (last 3 results) Recent Labs    10/31/17 1420  BNP 805.9*   Basic Metabolic Panel: Recent Labs  Lab 11/01/17 1422 11/05/17 0254 11/06/17 0318 11/07/17 0525  NA 139 128* 137  --   K 4.3 5.5* 4.2  --   CL 103 96* 100*  --   CO2 29 25 32  --   GLUCOSE 122* 121* 120*  --   BUN 7 17 15   --   CREATININE 0.97 0.96 0.86 0.94  CALCIUM 8.2* 8.1* 8.3*  --   MG 1.6*  --   --   --    Liver Function Tests: Recent Labs  Lab 11/01/17 1422 11/05/17 0254  AST 20 21  ALT 9* 8*  ALKPHOS 103 80  BILITOT 0.3 0.4  PROT 6.8 6.5  ALBUMIN 2.4* 2.4*   No results for input(s): LIPASE, AMYLASE in the last 168 hours. Recent Labs  Lab 11/05/17 0254  AMMONIA 36*   CBC: Recent Labs  Lab 11/01/17 0422 11/01/17 1422 11/05/17 0254  WBC 4.6 4.3 3.9*  NEUTROABS  --  2.1 1.9  HGB 10.0* 10.2* 9.9*  HCT 32.8* 34.2* 32.9*  MCV 71.6* 72.6* 71.7*  PLT 261 263 249   Cardiac Enzymes: Recent Labs  Lab 10/31/17 2222 11/01/17 0422 11/01/17 0922  TROPONINI <0.03 <0.03 <0.03   BNP: Invalid input(s): POCBNP CBG: Recent Labs  Lab 11/06/17 1128 11/06/17 1607 11/06/17 2120 11/07/17 0624 11/07/17 1133  GLUCAP 166* 223* 108* 163* 361*   D-Dimer No results for input(s): DDIMER in the last 72 hours. Hgb A1c No results for input(s): HGBA1C in the last 72 hours. Lipid Profile No results for input(s): CHOL, HDL, LDLCALC, TRIG, CHOLHDL, LDLDIRECT in the last 72 hours.  Thyroid function studies No results for input(s): TSH, T4TOTAL, T3FREE, THYROIDAB in the last 72 hours.  Invalid input(s): FREET3 Anemia work up No results for input(s): VITAMINB12, FOLATE, FERRITIN, TIBC, IRON, RETICCTPCT in the last 72 hours. Urinalysis    Component Value Date/Time   COLORURINE YELLOW 10/31/2017 1540   APPEARANCEUR CLEAR 10/31/2017 1540   LABSPEC 1.010 10/31/2017 1540   PHURINE 7.5 10/31/2017 1540   GLUCOSEU NEGATIVE  10/31/2017 1540   HGBUR NEGATIVE 10/31/2017 1540   BILIRUBINUR NEGATIVE 10/31/2017 1540   KETONESUR NEGATIVE 10/31/2017 1540   PROTEINUR NEGATIVE 10/31/2017 1540   UROBILINOGEN 1.0 12/08/2009 1217   NITRITE NEGATIVE 10/31/2017 1540   LEUKOCYTESUR NEGATIVE 10/31/2017 1540   Sepsis Labs Invalid input(s): PROCALCITONIN,  WBC,  LACTICIDVEN Microbiology Recent Results (from the past 240 hour(s))  Blood Culture (routine x 2)     Status: None   Collection Time: 10/31/17  2:20 PM  Result Value Ref Range Status   Specimen Description BLOOD BLOOD LEFT ARM  Final   Special Requests   Final    BOTTLES DRAWN AEROBIC AND ANAEROBIC Blood Culture results may not be optimal due to an inadequate volume of blood received in culture bottles   Culture   Final    NO GROWTH 5 DAYS Performed at [facility], [address] [place], [place] [address]    Report Status 11/05/2017 FINAL  Final  Blood Culture (routine x 2)     Status: None   Collection Time: 10/31/17  3:40 PM  Result Value Ref Range Status   Specimen Description BLOOD BLOOD LEFT WRIST  Final   Special Requests   Final    BOTTLES DRAWN AEROBIC AND ANAEROBIC Blood Culture adequate volume   Culture   Final    NO GROWTH 5 DAYS Performed at [facility], [address] [place], [place] [address]    Report Status 11/05/2017 FINAL  Final  Urine culture     Status: Abnormal   Collection Time: 10/31/17  3:40 PM  Result Value Ref Range Status   Specimen Description URINE, CLEAN CATCH  Final   Special Requests NONE  Final   Culture (A)  Final    <10,000 COLONIES/mL INSIGNIFICANT GROWTH Performed at [facility], [address], [place], [place] [address]    Report Status 11/01/2017 FINAL  Final     Time coordinating discharge: Over 30 minutes  SIGNED:   Alwyn Ren, MD  [facility] 11/07/2017, 2:48 PM Pager   If 7PM-7AM, please contact night-coverage [email or web address] Password [email or web address]

## 2017-11-07 NOTE — Evaluation (Signed)
Occupational Therapy Evaluation Patient Details Name: Alexandria Coffey MRN: [id number] DOB: 02/10/1951 Today's Date: 11/07/2017    History of Present Illness Pt is a 66 y/o female admitted secondary to respiratory distress and dx with CAP. PMH including but not limited to PVD, HTN, DM and cocaine abuse.    Clinical Impression   Pt requires min guard A to sup/set up with ADLs. Pt with SOB with decreased O2 SATs with mod activity. Pt will have assist from family at d/c. Pt provided with education and handouts for DME, A/E, energy conservation techniques and home safety. All education completed and no further acute OT is indicated at this time   Patient Saturations on Room Air at Rest = 89%  Patient Saturations on Room Air while Ambulating = 84%  Patient Saturations on 2 Liters of oxygen while Ambulating = 92%  Please briefly explain why patient needs home oxygen: Pt oxygen saturation does not stay above 90% on rest or while walking on room air.              Follow Up Recommendations  Home health OT;Supervision - Intermittent    Equipment Recommendations  Tub/shower seat;Other (comment)(reacher)    Recommendations for Other Services       Precautions / Restrictions Precautions Precautions: Fall Precaution Comments: monitor SPO2 Restrictions Weight Bearing Restrictions: No      Mobility Bed Mobility Overal bed mobility: Needs Assistance Bed Mobility: Supine to Sit;Sit to Supine     Supine to sit: Supervision Sit to supine: Supervision      Transfers Overall transfer level: Needs assistance Equipment used: None Transfers: Sit to/from Stand Sit to Stand: Min guard         General transfer comment: for safety    Balance Overall balance assessment: Needs assistance Sitting-balance support: Feet supported;No upper extremity supported Sitting balance-[name]: Good     Standing balance support: During functional activity;No upper extremity  supported Standing balance-[name]: Fair                             ADL either performed or assessed with clinical judgement   ADL Overall ADL's : Needs assistance/impaired     Grooming: Supervision/safety;Set up;Standing   Upper Body Bathing: Set up;Sitting   Lower Body Bathing: Sit to/from stand;Min guard;Supervison/ safety   Upper Body Dressing : Set up;Sitting   Lower Body Dressing: Min guard;Supervision/safety;Sit to/from stand   Toilet Transfer: Min guard;Regular Toilet;Grab bars;Ambulation   Toileting- Clothing Manipulation and Hygiene: Supervision/safety;Sit to/from stand   Tub/ Engineer, structural: Min guard   Functional mobility during ADLs: Min guard General ADL Comments: pt educated on energy conservation techniques, DME and A/E with handouts provided     Vision Patient Visual Report: No change from baseline       Perception     Praxis      Pertinent Vitals/Pain Pain Assessment: No/denies pain     Hand Dominance Right   Extremity/Trunk Assessment Upper Extremity Assessment Upper Extremity Assessment: Overall WFL for tasks assessed   Lower Extremity Assessment Lower Extremity Assessment: Defer to PT evaluation   Cervical / Trunk Assessment Cervical / Trunk Assessment: Normal   Communication Communication Communication: No difficulties   Cognition Arousal/Alertness: Awake/alert Behavior During Therapy: WFL for tasks assessed/performed Overall Cognitive Status: Within Functional Limits for tasks assessed  General Comments   pt very pleasant and cooperative    Exercises     Shoulder Instructions      Home Living Family/patient expects to be discharged to:: Private residence Living Arrangements: Children Available Help at Discharge: Family;Available PRN/intermittently Type of Home: House Home Access: Level entry     Home Layout: One level     Bathroom Shower/Tub:  Chief Strategy Officer: Standard     Home Equipment: None          Prior Functioning/Environment Level of Independence: Independent                 OT Problem List: Cardiopulmonary status limiting activity;Decreased activity tolerance;Impaired balance (sitting and/or standing)      OT Treatment/Interventions:      OT Goals(Current goals can be found in the care plan section) Acute Rehab OT Goals Patient Stated Goal: feel better anf go home OT Goal Formulation: With patient Time For Goal Achievement: 11/14/17  OT Frequency:     Barriers to D/C:    no barriers       Co-evaluation              AM-PAC PT "6 Clicks" Daily Activity     Outcome Measure Help from another person eating meals?: None Help from another person taking care of personal grooming?: A Little Help from another person toileting, which includes using toliet, bedpan, or urinal?: A Little Help from another person bathing (including washing, rinsing, drying)?: A Little Help from another person to put on and taking off regular upper body clothing?: A Little Help from another person to put on and taking off regular lower body clothing?: A Little 6 Click Score: 19   End of Session Equipment Utilized During Treatment: Other (comment)  Activity Tolerance: Patient tolerated treatment well Patient left: in bed(sitting EOB)  OT Visit Diagnosis: Other abnormalities of gait and mobility (R26.89)                Time: 3704-8889 OT Time Calculation (min): 23 min Charges:  OT General Charges $OT Visit: 1 Visit OT Evaluation $OT Eval Moderate Complexity: 1 Mod G-Codes: OT G-codes **NOT FOR INPATIENT CLASS** Functional Assessment Tool Used: AM-PAC 6 Clicks Daily Activity     Galen Manila 11/07/2017, 2:56 PM

## 2017-11-07 NOTE — Progress Notes (Signed)
Physical Therapy Treatment Patient Details Name: Alexandria Coffey MRN: [id number] DOB: [birth year] Today's Date: 11/07/2017    History of Present Illness Pt is a 66 y/o female admitted secondary to respiratory distress and dx with CAP. PMH including but not limited to PVD, HTN, DM and cocaine abuse.     PT Comments    Pt performed increased gait and DGI balance assessment.  Pt scored 15 which indicates falls in older community.  Pt will require assistance and close supervision at home until balance improves.  Plan next session for high level balance activities.      Follow Up Recommendations  Home health PT;Supervision/Assistance - 24 hour     Equipment Recommendations  None recommended by PT    Recommendations for Other Services       Precautions / Restrictions Precautions Precautions: Fall Precaution Comments: monitor SPO2 Restrictions Weight Bearing Restrictions: No    Mobility  Bed Mobility Overal bed mobility: Modified Independent Bed Mobility: Supine to Sit;Sit to Supine     Supine to sit: Modified independent (Device/Increase time) Sit to supine: Modified independent (Device/Increase time)   General bed mobility comments: No assistance needed.    Transfers Overall transfer level: Needs assistance Equipment used: None Transfers: Sit to/from Stand Sit to Stand: Supervision         General transfer comment: for safety  Ambulation/Gait Ambulation/Gait assistance: Supervision Ambulation Distance (Feet): 200 Feet Assistive device: None Gait Pattern/deviations: Step-through pattern;Decreased step length - right;Decreased step length - left;Decreased stride length     General Gait Details: mild instability with ambulation but no overt LOB, supervision for safety.   Stairs            Wheelchair Mobility    Modified Rankin (Stroke Patients Only)       Balance Overall balance assessment: Needs assistance Sitting-balance support: Feet supported;No  upper extremity supported Sitting balance-[name]: Good     Standing balance support: During functional activity;No upper extremity supported Standing balance-[name]: Fair                   Standardized Balance Assessment Standardized Balance Assessment : Dynamic Gait Index   Dynamic Gait Index Level Surface: Mild Impairment Change in Gait Speed: Mild Impairment Gait with Horizontal Head Turns: Mild Impairment Gait with Vertical Head Turns: Mild Impairment Gait and Pivot Turn: Mild Impairment Step Over Obstacle: Moderate Impairment Step Around Obstacles: Mild Impairment Steps: Mild Impairment Total Score: 15      Cognition Arousal/Alertness: Awake/alert Behavior During Therapy: WFL for tasks assessed/performed Overall Cognitive Status: Within Functional Limits for tasks assessed                                        Exercises      General Comments        Pertinent Vitals/Pain Pain Assessment: No/denies pain    Home Living Family/patient expects to be discharged to:: Private residence Living Arrangements: Children Available Help at Discharge: Family;Available PRN/intermittently Type of Home: House Home Access: Level entry   Home Layout: One level Home Equipment: None      Prior Function Level of Independence: Independent          PT Goals (current goals can now be found in the care plan section) Acute Rehab PT Goals Patient Stated Goal: feel better and go home Potential to Achieve Goals: Good Progress towards PT goals: Progressing toward  goals    Frequency    Min 3X/week      PT Plan Current plan remains appropriate    Co-evaluation              AM-PAC PT "6 Clicks" Daily Activity  Outcome Measure  Difficulty turning over in bed (including adjusting bedclothes, sheets and blankets)?: A Little Difficulty moving from lying on back to sitting on the side of the bed? : A Little Difficulty sitting down on  and standing up from a chair with arms (e.g., wheelchair, bedside commode, etc,.)?: A Little Help needed moving to and from a bed to chair (including a wheelchair)?: A Little Help needed walking in hospital room?: A Little Help needed climbing 3-5 steps with a railing? : A Lot 6 Click Score: 17    End of Session Equipment Utilized During Treatment: Gait belt;Oxygen Activity Tolerance: Patient limited by fatigue Patient left: in bed;with call bell/phone within reach;with bed alarm set;with family/visitor present Nurse Communication: Mobility status PT Visit Diagnosis: Other abnormalities of gait and mobility (R26.89)     Time: 1539-1600 PT Time Calculation (min) (ACUTE ONLY): 21 min  Charges:  $Gait Training: 8-22 mins                    G Codes:       Joycelyn Rua, PTA pager [phone]    Florestine Avers 11/07/2017, 4:11 PM

## 2017-11-07 NOTE — Care Management Important Message (Signed)
Important Message  Patient Details  Name: [name] MRN: [id number] Date of Birth: 28-Jan-1951   Medicare Important Message Given:  Yes    Dorena Bodo 11/07/2017, 3:25 PM

## 2017-11-07 NOTE — Progress Notes (Signed)
Discharge information given to patient including prescriptions. IV removed, clean and intact. Telemetry removed. Home oxygen delivered and pt educated. Daughter to take pt home with oxygen.  Versie Starks, RN

## 2017-11-10 ENCOUNTER — Encounter: Payer: Self-pay | Admitting: Pulmonary Disease

## 2017-11-10 ENCOUNTER — Ambulatory Visit (INDEPENDENT_AMBULATORY_CARE_PROVIDER_SITE_OTHER): Payer: Medicare Other | Admitting: Pulmonary Disease

## 2017-11-10 VITALS — BP 102/68 | HR 111 | Ht 62.0 in | Wt 119.0 lb

## 2017-11-10 DIAGNOSIS — R0602 Shortness of breath: Secondary | ICD-10-CM | POA: Diagnosis not present

## 2017-11-10 DIAGNOSIS — R911 Solitary pulmonary nodule: Secondary | ICD-10-CM

## 2017-11-10 NOTE — Patient Instructions (Addendum)
I am glad that her breathing is improving after your hospitalization Continue the supplemental oxygen for now We ordered a CT chest without contrast in 3 months time to follow-up on the lung nodule and the lung scarring We will schedule you for pulmonary function test to be done at the time of return visit.   Continue using the nebulizers as prescribed Please follow-up in 3 months.
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
## 2018-01-29 ENCOUNTER — Encounter (HOSPITAL_BASED_OUTPATIENT_CLINIC_OR_DEPARTMENT_OTHER): Payer: Self-pay | Admitting: Emergency Medicine

## 2018-01-29 ENCOUNTER — Emergency Department (HOSPITAL_BASED_OUTPATIENT_CLINIC_OR_DEPARTMENT_OTHER): Payer: Medicare Other

## 2018-01-29 ENCOUNTER — Emergency Department (HOSPITAL_BASED_OUTPATIENT_CLINIC_OR_DEPARTMENT_OTHER)
Admission: EM | Admit: 2018-01-29 | Discharge: 2018-01-29 | Disposition: A | Discharge status: Home / Self Care | Payer: Medicare Other | Attending: Emergency Medicine | Admitting: Emergency Medicine

## 2018-01-29 ENCOUNTER — Other Ambulatory Visit: Payer: Self-pay

## 2018-01-29 DIAGNOSIS — Z7982 Long term (current) use of aspirin: Secondary | ICD-10-CM | POA: Insufficient documentation

## 2018-01-29 DIAGNOSIS — Z79899 Other long term (current) drug therapy: Secondary | ICD-10-CM | POA: Diagnosis not present

## 2018-01-29 DIAGNOSIS — F17210 Nicotine dependence, cigarettes, uncomplicated: Secondary | ICD-10-CM | POA: Insufficient documentation

## 2018-01-29 DIAGNOSIS — M25511 Pain in right shoulder: Secondary | ICD-10-CM | POA: Diagnosis present

## 2018-01-29 DIAGNOSIS — I10 Essential (primary) hypertension: Secondary | ICD-10-CM | POA: Diagnosis not present

## 2018-01-29 DIAGNOSIS — S4991XA Unspecified injury of right shoulder and upper arm, initial encounter: Secondary | ICD-10-CM

## 2018-01-29 DIAGNOSIS — E119 Type 2 diabetes mellitus without complications: Secondary | ICD-10-CM | POA: Diagnosis not present

## 2018-01-29 MED ORDER — IBUPROFEN 800 MG PO TABS
800.0000 mg | ORAL_TABLET | Freq: Once | ORAL | Status: AC
  Administered 2018-01-29: 800 mg via ORAL
  Filled 2018-01-29: qty 1

## 2018-01-29 MED ORDER — IBUPROFEN 800 MG PO TABS: 800.0000 mg | ORAL_TABLET | Freq: Three times a day (TID) | ORAL | 0 refills | Status: DC | PRN

## 2018-01-29 MED FILL — IBUPROFEN 800 MG TAB: 800 | 7 days supply | Qty: 21 | Fill #0

## 2018-01-29 NOTE — Discharge Instructions (Signed)
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
## 2018-01-29 NOTE — ED Triage Notes (Signed)
Pt reports tripped over O2 tubing last night; c/o RT shoulder, neck and upper arm pain

## 2018-01-29 NOTE — ED Notes (Signed)
Pt verbalized dc instructions

## 2018-01-29 NOTE — ED Notes (Signed)
Patient transported to X-ray 

## 2018-02-08 ENCOUNTER — Ambulatory Visit (INDEPENDENT_AMBULATORY_CARE_PROVIDER_SITE_OTHER)
Admission: RE | Admit: 2018-02-08 | Discharge: 2018-02-08 | Disposition: A | Discharge status: Home / Self Care | Payer: Medicare Other | Source: Ambulatory Visit | Attending: Pulmonary Disease | Admitting: Pulmonary Disease

## 2018-02-08 DIAGNOSIS — R911 Solitary pulmonary nodule: Secondary | ICD-10-CM

## 2018-02-09 ENCOUNTER — Telehealth: Payer: Self-pay | Admitting: Pulmonary Disease

## 2018-02-09 NOTE — Telephone Encounter (Signed)
Nodule hasn't changed

## 2018-02-09 NOTE — Telephone Encounter (Signed)
Spoke with pt's daughter, Dorann Lodge. We do not have a DPR on file for the pt so no information was given to her. She is requesting pt's CT results. Dorann Lodge was told the Dr. Isaiah Serge was out of the office until 02/19/2018. Pt's daughter does not want to wait until then.  Dr. Kendrick Fries - please advise. Thanks.

## 2018-02-12 NOTE — Telephone Encounter (Signed)
Spoke with pt's daughter (listed in permanent comments DPR) and notified of recs per BQ  She verbalized understanding

## 2018-02-12 NOTE — Telephone Encounter (Signed)
Alexandria Coffey just spoke with patient nothing further needed.

## 2018-02-19 ENCOUNTER — Encounter: Payer: Self-pay | Admitting: Pulmonary Disease

## 2018-02-19 ENCOUNTER — Ambulatory Visit (INDEPENDENT_AMBULATORY_CARE_PROVIDER_SITE_OTHER): Payer: Medicare Other | Admitting: Pulmonary Disease

## 2018-02-19 ENCOUNTER — Ambulatory Visit: Payer: Medicare Other | Admitting: Family Medicine

## 2018-02-19 VITALS — BP 106/78 | HR 101 | Ht 62.0 in | Wt 118.0 lb

## 2018-02-19 DIAGNOSIS — R0602 Shortness of breath: Secondary | ICD-10-CM

## 2018-02-19 DIAGNOSIS — R911 Solitary pulmonary nodule: Secondary | ICD-10-CM

## 2018-02-19 DIAGNOSIS — J8410 Pulmonary fibrosis, unspecified: Secondary | ICD-10-CM

## 2018-02-19 LAB — PULMONARY FUNCTION TEST
DL/VA % pred: 86 %
DL/VA: 3.93 ml/min/mmHg/L
DLCO unc % pred: 32 %
DLCO unc: 6.97 ml/min/mmHg
FEF 25-75 Pre: 1.49 L/sec
FEF2575-%Pred-Pre: 88 %
FEV1-%PRED-PRE: 43 %
FEV1-PRE: 0.76 L
FEV1FVC-%Pred-Pre: 118 %
FEV6-%PRED-PRE: 37 %
FEV6-PRE: 0.81 L
FEV6FVC-%PRED-PRE: 103 %
FVC-%PRED-PRE: 36 %
FVC-PRE: 0.81 L
PRE FEV1/FVC RATIO: 93 %
Pre FEV6/FVC Ratio: 100 %
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
## 2018-02-19 NOTE — Progress Notes (Signed)
Cleda Daub and DLCO completed only. Patient states that she is unable to complete the rest of the test. Patient states "due to my lungs I cannot finish"

## 2018-02-19 NOTE — Patient Instructions (Signed)
Continue albuterol as needed Continue supplemental oxygen CT scan continues to be stable We will follow-up with repeat CT in 6 months time Follow-up in clinic after CT scan.

## 2018-04-16 ENCOUNTER — Encounter (HOSPITAL_BASED_OUTPATIENT_CLINIC_OR_DEPARTMENT_OTHER): Payer: Self-pay | Admitting: *Deleted

## 2018-04-16 ENCOUNTER — Emergency Department (HOSPITAL_BASED_OUTPATIENT_CLINIC_OR_DEPARTMENT_OTHER): Payer: Medicare Other

## 2018-04-16 ENCOUNTER — Other Ambulatory Visit: Payer: Self-pay

## 2018-04-16 ENCOUNTER — Emergency Department (HOSPITAL_BASED_OUTPATIENT_CLINIC_OR_DEPARTMENT_OTHER)
Admission: EM | Admit: 2018-04-16 | Discharge: 2018-04-16 | Disposition: A | Discharge status: Home / Self Care | Payer: Medicare Other | Attending: Emergency Medicine | Admitting: Emergency Medicine

## 2018-04-16 DIAGNOSIS — R079 Chest pain, unspecified: Secondary | ICD-10-CM | POA: Diagnosis present

## 2018-04-16 DIAGNOSIS — Z5321 Procedure and treatment not carried out due to patient leaving prior to being seen by health care provider: Secondary | ICD-10-CM | POA: Diagnosis not present

## 2018-04-16 LAB — CBC
HCT: 34 % — ABNORMAL LOW (ref 36.0–46.0)
Hemoglobin: 11.4 g/dL — ABNORMAL LOW (ref 12.0–15.0)
MCH: 25.9 pg — AB (ref 26.0–34.0)
MCHC: 33.5 g/dL (ref 30.0–36.0)
MCV: 77.3 fL — AB (ref 78.0–100.0)
PLATELETS: 281 10*3/uL (ref 150–400)
RBC: 4.4 MIL/uL (ref 3.87–5.11)
RDW: 16.9 % — AB (ref 11.5–15.5)
WBC: 5.8 10*3/uL (ref 4.0–10.5)

## 2018-04-16 LAB — BASIC METABOLIC PANEL
Anion gap: 15 (ref 5–15)
BUN: 9 mg/dL (ref 6–20)
CALCIUM: 8.3 mg/dL — AB (ref 8.9–10.3)
CO2: 20 mmol/L — AB (ref 22–32)
CREATININE: 1.15 mg/dL — AB (ref 0.44–1.00)
Chloride: 103 mmol/L (ref 101–111)
GFR calc Af Amer: 56 mL/min — ABNORMAL LOW (ref >60)
GFR calc non Af Amer: 48 mL/min — ABNORMAL LOW (ref >60)
GLUCOSE: 176 mg/dL — AB (ref 65–99)
Potassium: 4.1 mmol/L (ref 3.5–5.1)
Sodium: 138 mmol/L (ref 135–145)

## 2018-04-16 LAB — TROPONIN I

## 2018-04-16 NOTE — ED Notes (Signed)
Called to waiting area, patient has supplemental O2 @ home.  Pulse ox reading 85% on r/a, HR 105. Placed on 2l/m [employer] with portable tank.  SpO2 95% HR 98.

## 2018-04-16 NOTE — ED Triage Notes (Addendum)
Sharp chest pain that made her have nausea and vomiting this afternoon. States she has the same pain once a month for the past several months. Pain is in her epigastric area.

## 2018-06-05 ENCOUNTER — Encounter (HOSPITAL_BASED_OUTPATIENT_CLINIC_OR_DEPARTMENT_OTHER): Payer: Self-pay

## 2018-06-05 ENCOUNTER — Other Ambulatory Visit: Payer: Self-pay

## 2018-06-05 ENCOUNTER — Emergency Department (HOSPITAL_BASED_OUTPATIENT_CLINIC_OR_DEPARTMENT_OTHER): Payer: Medicare Other

## 2018-06-05 ENCOUNTER — Observation Stay (HOSPITAL_BASED_OUTPATIENT_CLINIC_OR_DEPARTMENT_OTHER)
Admission: EM | Admit: 2018-06-05 | Discharge: 2018-06-07 | Disposition: A | Discharge status: Home Health | Payer: Medicare Other | Attending: Internal Medicine | Admitting: Internal Medicine

## 2018-06-05 DIAGNOSIS — Z7984 Long term (current) use of oral hypoglycemic drugs: Secondary | ICD-10-CM | POA: Insufficient documentation

## 2018-06-05 DIAGNOSIS — I110 Hypertensive heart disease with heart failure: Secondary | ICD-10-CM | POA: Insufficient documentation

## 2018-06-05 DIAGNOSIS — Q240 Dextrocardia: Secondary | ICD-10-CM | POA: Diagnosis not present

## 2018-06-05 DIAGNOSIS — K219 Gastro-esophageal reflux disease without esophagitis: Secondary | ICD-10-CM | POA: Diagnosis present

## 2018-06-05 DIAGNOSIS — I272 Pulmonary hypertension, unspecified: Secondary | ICD-10-CM | POA: Insufficient documentation

## 2018-06-05 DIAGNOSIS — J8410 Pulmonary fibrosis, unspecified: Secondary | ICD-10-CM | POA: Diagnosis present

## 2018-06-05 DIAGNOSIS — I700 Atherosclerosis of aorta: Secondary | ICD-10-CM | POA: Insufficient documentation

## 2018-06-05 DIAGNOSIS — Z720 Tobacco use: Secondary | ICD-10-CM | POA: Diagnosis present

## 2018-06-05 DIAGNOSIS — R55 Syncope and collapse: Principal | ICD-10-CM | POA: Diagnosis present

## 2018-06-05 DIAGNOSIS — G3189 Other specified degenerative diseases of nervous system: Secondary | ICD-10-CM | POA: Insufficient documentation

## 2018-06-05 DIAGNOSIS — Z888 Allergy status to other drugs, medicaments and biological substances status: Secondary | ICD-10-CM | POA: Insufficient documentation

## 2018-06-05 DIAGNOSIS — F419 Anxiety disorder, unspecified: Secondary | ICD-10-CM | POA: Diagnosis not present

## 2018-06-05 DIAGNOSIS — I5032 Chronic diastolic (congestive) heart failure: Secondary | ICD-10-CM | POA: Diagnosis not present

## 2018-06-05 DIAGNOSIS — E7800 Pure hypercholesterolemia, unspecified: Secondary | ICD-10-CM | POA: Insufficient documentation

## 2018-06-05 DIAGNOSIS — Z9981 Dependence on supplemental oxygen: Secondary | ICD-10-CM | POA: Diagnosis not present

## 2018-06-05 DIAGNOSIS — W19XXXA Unspecified fall, initial encounter: Secondary | ICD-10-CM | POA: Diagnosis not present

## 2018-06-05 DIAGNOSIS — E1151 Type 2 diabetes mellitus with diabetic peripheral angiopathy without gangrene: Secondary | ICD-10-CM | POA: Diagnosis present

## 2018-06-05 DIAGNOSIS — F17210 Nicotine dependence, cigarettes, uncomplicated: Secondary | ICD-10-CM | POA: Insufficient documentation

## 2018-06-05 DIAGNOSIS — I071 Rheumatic tricuspid insufficiency: Secondary | ICD-10-CM | POA: Insufficient documentation

## 2018-06-05 DIAGNOSIS — M25572 Pain in left ankle and joints of left foot: Secondary | ICD-10-CM | POA: Diagnosis not present

## 2018-06-05 DIAGNOSIS — I10 Essential (primary) hypertension: Secondary | ICD-10-CM | POA: Diagnosis present

## 2018-06-05 DIAGNOSIS — Z79899 Other long term (current) drug therapy: Secondary | ICD-10-CM | POA: Insufficient documentation

## 2018-06-05 DIAGNOSIS — E871 Hypo-osmolality and hyponatremia: Secondary | ICD-10-CM | POA: Diagnosis not present

## 2018-06-05 DIAGNOSIS — I5082 Biventricular heart failure: Secondary | ICD-10-CM | POA: Insufficient documentation

## 2018-06-05 DIAGNOSIS — F329 Major depressive disorder, single episode, unspecified: Secondary | ICD-10-CM | POA: Insufficient documentation

## 2018-06-05 DIAGNOSIS — K5900 Constipation, unspecified: Secondary | ICD-10-CM | POA: Diagnosis not present

## 2018-06-05 DIAGNOSIS — J9611 Chronic respiratory failure with hypoxia: Secondary | ICD-10-CM | POA: Diagnosis present

## 2018-06-05 DIAGNOSIS — B1920 Unspecified viral hepatitis C without hepatic coma: Secondary | ICD-10-CM | POA: Diagnosis not present

## 2018-06-05 DIAGNOSIS — I50813 Acute on chronic right heart failure: Secondary | ICD-10-CM

## 2018-06-05 DIAGNOSIS — I739 Peripheral vascular disease, unspecified: Secondary | ICD-10-CM | POA: Diagnosis present

## 2018-06-05 DIAGNOSIS — J449 Chronic obstructive pulmonary disease, unspecified: Secondary | ICD-10-CM | POA: Insufficient documentation

## 2018-06-05 HISTORY — DX: Pulmonary fibrosis, unspecified: J84.10

## 2018-06-05 HISTORY — DX: Heart failure, unspecified: I50.9

## 2018-06-05 NOTE — ED Notes (Signed)
Patient transported to X-ray 

## 2018-06-05 NOTE — ED Notes (Signed)
O2 sat in the 70s-pt states she is on O2 "24/7"-did not wear/bring O2 to ED

## 2018-06-05 NOTE — ED Triage Notes (Signed)
Pt states she passed out approx 30 min PTA-was out approx 15-20 sec per grnadson-fell onto hardwood floor at home-denies pain from fall-to triage in w/c
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
## 2018-06-06 ENCOUNTER — Observation Stay (HOSPITAL_COMMUNITY): Payer: Medicare Other

## 2018-06-06 ENCOUNTER — Encounter (HOSPITAL_BASED_OUTPATIENT_CLINIC_OR_DEPARTMENT_OTHER): Payer: Self-pay | Admitting: Emergency Medicine

## 2018-06-06 ENCOUNTER — Observation Stay (HOSPITAL_BASED_OUTPATIENT_CLINIC_OR_DEPARTMENT_OTHER): Payer: Medicare Other

## 2018-06-06 DIAGNOSIS — R55 Syncope and collapse: Secondary | ICD-10-CM | POA: Diagnosis not present

## 2018-06-06 DIAGNOSIS — I5032 Chronic diastolic (congestive) heart failure: Secondary | ICD-10-CM | POA: Diagnosis present

## 2018-06-06 DIAGNOSIS — I50813 Acute on chronic right heart failure: Secondary | ICD-10-CM

## 2018-06-06 DIAGNOSIS — J9611 Chronic respiratory failure with hypoxia: Secondary | ICD-10-CM | POA: Diagnosis present

## 2018-06-06 DIAGNOSIS — I361 Nonrheumatic tricuspid (valve) insufficiency: Secondary | ICD-10-CM

## 2018-06-06 DIAGNOSIS — W19XXXA Unspecified fall, initial encounter: Secondary | ICD-10-CM | POA: Diagnosis not present

## 2018-06-06 DIAGNOSIS — Z888 Allergy status to other drugs, medicaments and biological substances status: Secondary | ICD-10-CM | POA: Diagnosis not present

## 2018-06-06 DIAGNOSIS — I5082 Biventricular heart failure: Secondary | ICD-10-CM | POA: Diagnosis not present

## 2018-06-06 DIAGNOSIS — B1920 Unspecified viral hepatitis C without hepatic coma: Secondary | ICD-10-CM | POA: Diagnosis not present

## 2018-06-06 DIAGNOSIS — J8410 Pulmonary fibrosis, unspecified: Secondary | ICD-10-CM

## 2018-06-06 DIAGNOSIS — I272 Pulmonary hypertension, unspecified: Secondary | ICD-10-CM | POA: Diagnosis not present

## 2018-06-06 DIAGNOSIS — I10 Essential (primary) hypertension: Secondary | ICD-10-CM | POA: Diagnosis present

## 2018-06-06 DIAGNOSIS — I739 Peripheral vascular disease, unspecified: Secondary | ICD-10-CM

## 2018-06-06 DIAGNOSIS — E7800 Pure hypercholesterolemia, unspecified: Secondary | ICD-10-CM | POA: Diagnosis not present

## 2018-06-06 DIAGNOSIS — J449 Chronic obstructive pulmonary disease, unspecified: Secondary | ICD-10-CM | POA: Diagnosis not present

## 2018-06-06 DIAGNOSIS — I110 Hypertensive heart disease with heart failure: Secondary | ICD-10-CM | POA: Diagnosis not present

## 2018-06-06 DIAGNOSIS — F329 Major depressive disorder, single episode, unspecified: Secondary | ICD-10-CM | POA: Diagnosis not present

## 2018-06-06 DIAGNOSIS — K219 Gastro-esophageal reflux disease without esophagitis: Secondary | ICD-10-CM | POA: Diagnosis not present

## 2018-06-06 DIAGNOSIS — F17210 Nicotine dependence, cigarettes, uncomplicated: Secondary | ICD-10-CM | POA: Diagnosis not present

## 2018-06-06 DIAGNOSIS — G3189 Other specified degenerative diseases of nervous system: Secondary | ICD-10-CM | POA: Diagnosis not present

## 2018-06-06 DIAGNOSIS — Z720 Tobacco use: Secondary | ICD-10-CM

## 2018-06-06 DIAGNOSIS — F419 Anxiety disorder, unspecified: Secondary | ICD-10-CM | POA: Diagnosis not present

## 2018-06-06 DIAGNOSIS — I700 Atherosclerosis of aorta: Secondary | ICD-10-CM | POA: Diagnosis not present

## 2018-06-06 DIAGNOSIS — K5900 Constipation, unspecified: Secondary | ICD-10-CM | POA: Diagnosis not present

## 2018-06-06 DIAGNOSIS — I071 Rheumatic tricuspid insufficiency: Secondary | ICD-10-CM | POA: Diagnosis not present

## 2018-06-06 DIAGNOSIS — Z9981 Dependence on supplemental oxygen: Secondary | ICD-10-CM | POA: Diagnosis not present

## 2018-06-06 DIAGNOSIS — Q240 Dextrocardia: Secondary | ICD-10-CM | POA: Diagnosis not present

## 2018-06-06 DIAGNOSIS — E871 Hypo-osmolality and hyponatremia: Secondary | ICD-10-CM | POA: Diagnosis not present

## 2018-06-06 DIAGNOSIS — M25572 Pain in left ankle and joints of left foot: Secondary | ICD-10-CM | POA: Diagnosis not present

## 2018-06-06 DIAGNOSIS — E1151 Type 2 diabetes mellitus with diabetic peripheral angiopathy without gangrene: Secondary | ICD-10-CM | POA: Diagnosis not present

## 2018-06-06 LAB — CBC WITH DIFFERENTIAL/PLATELET
BASOS ABS: 0 10*3/uL (ref 0.0–0.1)
Basophils Relative: 1 %
EOS ABS: 0 10*3/uL (ref 0.0–0.7)
Eosinophils Relative: 1 %
HCT: 31.9 % — ABNORMAL LOW (ref 36.0–46.0)
HEMOGLOBIN: 10 g/dL — AB (ref 12.0–15.0)
LYMPHS ABS: 1.6 10*3/uL (ref 0.7–4.0)
Lymphocytes Relative: 35 %
MCH: 20.7 pg — ABNORMAL LOW (ref 26.0–34.0)
MCHC: 31.3 g/dL (ref 30.0–36.0)
MCV: 66 fL — ABNORMAL LOW (ref 78.0–100.0)
MONO ABS: 0.8 10*3/uL (ref 0.1–1.0)
MONOS PCT: 16 %
Neutro Abs: 2.3 10*3/uL (ref 1.7–7.7)
Neutrophils Relative %: 47 %
Platelets: 349 10*3/uL (ref 150–400)
RBC: 4.83 MIL/uL (ref 3.87–5.11)
RDW: 21.1 % — ABNORMAL HIGH (ref 11.5–15.5)
WBC: 4.7 10*3/uL (ref 4.0–10.5)

## 2018-06-06 LAB — GLUCOSE, CAPILLARY
GLUCOSE-CAPILLARY: 269 mg/dL — AB (ref 70–99)
GLUCOSE-CAPILLARY: 200 mg/dL — AB (ref 70–99)
GLUCOSE-CAPILLARY: 164 mg/dL — AB (ref 70–99)
Glucose-Capillary: 116 mg/dL — ABNORMAL HIGH (ref 70–99)
Glucose-Capillary: 57 mg/dL — ABNORMAL LOW (ref 70–99)

## 2018-06-06 LAB — COMPREHENSIVE METABOLIC PANEL
ALK PHOS: 111 U/L (ref 38–126)
ALT: 14 U/L (ref 0–44)
AST: 21 U/L (ref 15–41)
Albumin: 2.7 g/dL — ABNORMAL LOW (ref 3.5–5.0)
Anion gap: 10 (ref 5–15)
BILIRUBIN TOTAL: 0.4 mg/dL (ref 0.3–1.2)
BUN: 11 mg/dL (ref 8–23)
CHLORIDE: 92 mmol/L — AB (ref 98–111)
CO2: 27 mmol/L (ref 22–32)
CREATININE: 0.94 mg/dL (ref 0.44–1.00)
Calcium: 8 mg/dL — ABNORMAL LOW (ref 8.9–10.3)
GFR calc Af Amer: >60 mL/min (ref >60)
Glucose, Bld: 202 mg/dL — ABNORMAL HIGH (ref 70–99)
Potassium: 3.6 mmol/L (ref 3.5–5.1)
Sodium: 129 mmol/L — ABNORMAL LOW (ref 135–145)
TOTAL PROTEIN: 7.6 g/dL (ref 6.5–8.1)

## 2018-06-06 LAB — ECHOCARDIOGRAM COMPLETE
HEIGHTINCHES: 62 in
WEIGHTICAEL: 1888 [oz_av]

## 2018-06-06 LAB — URINALYSIS, ROUTINE W REFLEX MICROSCOPIC
Bilirubin Urine: NEGATIVE
GLUCOSE, UA: NEGATIVE mg/dL
Hgb urine dipstick: NEGATIVE
Ketones, ur: NEGATIVE mg/dL
Leukocytes, UA: NEGATIVE
Nitrite: NEGATIVE
PH: 6 (ref 5.0–8.0)
Protein, ur: NEGATIVE mg/dL

## 2018-06-06 LAB — MAGNESIUM: Magnesium: 1.4 mg/dL — ABNORMAL LOW (ref 1.7–2.4)

## 2018-06-06 LAB — TROPONIN I
Troponin I: <0.03 ng/mL
Troponin I: <0.03 ng/mL
Troponin I: <0.03 ng/mL
Troponin I: <0.03 ng/mL (ref <0.03)

## 2018-06-06 LAB — D-DIMER, QUANTITATIVE: D-Dimer, Quant: 1.19 ug/mL-FEU — ABNORMAL HIGH (ref 0.00–0.50)

## 2018-06-06 LAB — HEMOGLOBIN A1C
Hgb A1c MFr Bld: 7.2 % — ABNORMAL HIGH (ref 4.8–5.6)
Mean Plasma Glucose: 160 mg/dL

## 2018-06-06 LAB — BRAIN NATRIURETIC PEPTIDE: B Natriuretic Peptide: 983.1 pg/mL — ABNORMAL HIGH (ref 0.0–100.0)

## 2018-06-06 MED ORDER — ENSURE ENLIVE PO LIQD
237.0000 mL | Freq: Two times a day (BID) | ORAL | Status: DC
  Administered 2018-06-06: 237 mL via ORAL

## 2018-06-06 MED ORDER — AMLODIPINE BESYLATE 5 MG PO TABS
5.0000 mg | ORAL_TABLET | Freq: Every day | ORAL | Status: DC
  Administered 2018-06-06 – 2018-06-07 (×2): 5 mg via ORAL
  Filled 2018-06-06 (×2): qty 1

## 2018-06-06 MED ORDER — ASPIRIN EC 81 MG PO TBEC
81.0000 mg | DELAYED_RELEASE_TABLET | Freq: Every day | ORAL | Status: DC
  Administered 2018-06-06 – 2018-06-07 (×2): 81 mg via ORAL
  Filled 2018-06-06 (×4): qty 1

## 2018-06-06 MED ORDER — ENOXAPARIN SODIUM 40 MG/0.4ML ~~LOC~~ SOLN
40.0000 mg | SUBCUTANEOUS | Status: DC
  Administered 2018-06-06 – 2018-06-07 (×2): 40 mg via SUBCUTANEOUS
  Filled 2018-06-06 (×2): qty 0.4

## 2018-06-06 MED ORDER — ALPRAZOLAM 0.5 MG PO TABS
0.5000 mg | ORAL_TABLET | Freq: Three times a day (TID) | ORAL | Status: DC | PRN
  Administered 2018-06-06 – 2018-06-07 (×2): 0.5 mg via ORAL
  Filled 2018-06-06 (×2): qty 1

## 2018-06-06 MED ORDER — IPRATROPIUM-ALBUTEROL 0.5-2.5 (3) MG/3ML IN SOLN
3.0000 mL | Freq: Two times a day (BID) | RESPIRATORY_TRACT | Status: DC
  Administered 2018-06-06 – 2018-06-07 (×2): 3 mL via RESPIRATORY_TRACT
  Filled 2018-06-06 (×3): qty 3

## 2018-06-06 MED ORDER — ZOLPIDEM TARTRATE 5 MG PO TABS
5.0000 mg | ORAL_TABLET | Freq: Every evening | ORAL | Status: DC | PRN
Start: 2018-06-06 — End: 2018-06-07

## 2018-06-06 MED ORDER — FUROSEMIDE 10 MG/ML IJ SOLN
60.0000 mg | Freq: Once | INTRAMUSCULAR | Status: AC
  Administered 2018-06-06: 60 mg via INTRAVENOUS
  Filled 2018-06-06: qty 6

## 2018-06-06 MED ORDER — INSULIN ASPART 100 UNIT/ML ~~LOC~~ SOLN
0.0000 [IU] | Freq: Three times a day (TID) | SUBCUTANEOUS | Status: DC
  Administered 2018-06-06: 5 [IU] via SUBCUTANEOUS
  Administered 2018-06-06: 2 [IU] via SUBCUTANEOUS
  Administered 2018-06-07 (×2): 1 [IU] via SUBCUTANEOUS
  Administered 2018-06-07: 3 [IU] via SUBCUTANEOUS

## 2018-06-06 MED ORDER — MAGNESIUM SULFATE 2 GM/50ML IV SOLN
2.0000 g | Freq: Once | INTRAVENOUS | Status: AC
  Administered 2018-06-06: 2 g via INTRAVENOUS
  Filled 2018-06-06: qty 50

## 2018-06-06 MED ORDER — ONDANSETRON HCL 4 MG PO TABS
4.0000 mg | ORAL_TABLET | Freq: Four times a day (QID) | ORAL | Status: DC | PRN
Start: 2018-06-06 — End: 2018-06-07

## 2018-06-06 MED ORDER — CILOSTAZOL 50 MG PO TABS
50.0000 mg | ORAL_TABLET | Freq: Two times a day (BID) | ORAL | Status: DC
  Administered 2018-06-06 – 2018-06-07 (×3): 50 mg via ORAL
  Filled 2018-06-06 (×4): qty 1

## 2018-06-06 MED ORDER — SODIUM CHLORIDE 0.9% FLUSH
3.0000 mL | Freq: Two times a day (BID) | INTRAVENOUS | Status: DC
  Administered 2018-06-06 – 2018-06-07 (×3): 3 mL via INTRAVENOUS

## 2018-06-06 MED ORDER — ACETAMINOPHEN 325 MG PO TABS
650.0000 mg | ORAL_TABLET | Freq: Four times a day (QID) | ORAL | Status: DC | PRN
  Administered 2018-06-06: 650 mg via ORAL
  Filled 2018-06-06: qty 2

## 2018-06-06 MED ORDER — NICOTINE 21 MG/24HR TD PT24
21.0000 mg | MEDICATED_PATCH | Freq: Every day | TRANSDERMAL | Status: DC
  Administered 2018-06-06 – 2018-06-07 (×2): 21 mg via TRANSDERMAL
  Filled 2018-06-06 (×2): qty 1

## 2018-06-06 MED ORDER — ONDANSETRON HCL 4 MG/2ML IJ SOLN: 4.0000 mg | Freq: Four times a day (QID) | INTRAMUSCULAR | Status: DC | PRN

## 2018-06-06 MED ORDER — AMITRIPTYLINE HCL 50 MG PO TABS
50.0000 mg | ORAL_TABLET | Freq: Every day | ORAL | Status: DC
  Administered 2018-06-06: 50 mg via ORAL
  Filled 2018-06-06: qty 1

## 2018-06-06 MED ORDER — GUAIFENESIN-DM 100-10 MG/5ML PO SYRP
5.0000 mL | ORAL_SOLUTION | ORAL | Status: DC | PRN
  Administered 2018-06-07: 5 mL via ORAL
  Filled 2018-06-06: qty 5

## 2018-06-06 MED ORDER — METOPROLOL TARTRATE 25 MG PO TABS
25.0000 mg | ORAL_TABLET | Freq: Two times a day (BID) | ORAL | Status: DC
  Administered 2018-06-06 – 2018-06-07 (×3): 25 mg via ORAL
  Filled 2018-06-06 (×3): qty 1

## 2018-06-06 MED ORDER — DIFLUPREDNATE 0.05 % OP EMUL: 1.0000 [drp] | Freq: Four times a day (QID) | OPHTHALMIC | Status: DC

## 2018-06-06 MED ORDER — ACETAMINOPHEN 650 MG RE SUPP: 650.0000 mg | Freq: Four times a day (QID) | RECTAL | Status: DC | PRN

## 2018-06-06 MED ORDER — HYDROXYZINE HCL 10 MG PO TABS
10.0000 mg | ORAL_TABLET | Freq: Four times a day (QID) | ORAL | Status: DC | PRN
  Administered 2018-06-07: 10 mg via ORAL
  Filled 2018-06-06 (×2): qty 1

## 2018-06-06 MED ORDER — POLYETHYLENE GLYCOL 3350 17 G PO PACK: 17.0000 g | PACK | Freq: Every day | ORAL | Status: DC | PRN

## 2018-06-06 MED ORDER — GLUCERNA SHAKE PO LIQD
237.0000 mL | Freq: Three times a day (TID) | ORAL | Status: DC
Start: 2018-06-06 — End: 2018-06-07
  Administered 2018-06-06 – 2018-06-07 (×2): 237 mL via ORAL
  Filled 2018-06-06: qty 237

## 2018-06-06 MED ORDER — IPRATROPIUM-ALBUTEROL 0.5-2.5 (3) MG/3ML IN SOLN
3.0000 mL | Freq: Four times a day (QID) | RESPIRATORY_TRACT | Status: DC
  Administered 2018-06-06: 3 mL via RESPIRATORY_TRACT
  Filled 2018-06-06: qty 3

## 2018-06-06 MED ORDER — ALBUTEROL SULFATE (2.5 MG/3ML) 0.083% IN NEBU: 2.5000 mg | INHALATION_SOLUTION | RESPIRATORY_TRACT | Status: DC | PRN

## 2018-06-06 MED ORDER — FAMOTIDINE 20 MG PO TABS
20.0000 mg | ORAL_TABLET | Freq: Two times a day (BID) | ORAL | Status: DC
  Administered 2018-06-06 – 2018-06-07 (×3): 20 mg via ORAL
  Filled 2018-06-06 (×3): qty 1

## 2018-06-06 MED ORDER — FUROSEMIDE 10 MG/ML IJ SOLN
40.0000 mg | Freq: Once | INTRAMUSCULAR | Status: AC
  Administered 2018-06-06: 40 mg via INTRAVENOUS
  Filled 2018-06-06: qty 4

## 2018-06-06 MED ORDER — KETOROLAC TROMETHAMINE 0.5 % OP SOLN: 1.0000 [drp] | Freq: Four times a day (QID) | OPHTHALMIC | Status: DC

## 2018-06-06 MED ORDER — HYDRALAZINE HCL 20 MG/ML IJ SOLN: 5.0000 mg | INTRAMUSCULAR | Status: DC | PRN

## 2018-06-06 MED ORDER — INSULIN ASPART 100 UNIT/ML ~~LOC~~ SOLN: 0.0000 [IU] | Freq: Every day | SUBCUTANEOUS | Status: DC

## 2018-06-06 MED ORDER — IOPAMIDOL (ISOVUE-370) INJECTION 76%
INTRAVENOUS | Status: AC
  Administered 2018-06-06: 100 mL
  Filled 2018-06-06: qty 100

## 2018-06-06 MED ORDER — GATIFLOXACIN 0.5 % OP SOLN: 1.0000 [drp] | Freq: Four times a day (QID) | OPHTHALMIC | Status: DC

## 2018-06-06 NOTE — Plan of Care (Signed)
Discussed plan of care with patient.  Patient has had a decreased appetite for several weeks without explanation.  This RN encouraged patient to eat frequent small meals.  Minimum  teach back displayed.

## 2018-06-06 NOTE — Progress Notes (Signed)
Patient seen and examined  67 y.o. female Pulm HTN, COPD on 2LNC, diastolic heart failure on Lasix who presents to the emergency department for syncopal episode, after a cough she became unconscious. She stopped taking Lasix one week ago because of generalized weakness and fatigue.    Syncope and fall: differential diagnosis cough syncope versus due to hypoxia from not wearing oxygen/stopping Lasix versus ongoing cocaine use. Urine drug screen pending.     Another differential diagnosis is orthostatic  hypertension.  No focal neurologic findings on physical examination, less likely to have stroke.  No seizure activity noted. Pt has left ankle injury after fall.  2-D echo ordered and pending - cycle cardiac enzymes, continue telemetry - Orthostatic vital signs  Check magnesium - Ct-head no acute intracranial process - X-ray of L ankle no acute fracture or dislocation  patient also admits to using alcohol and is requesting something for anxiety   Chronic respiratory failure with hypoxia and pulmonary fibrosis: -DuoNeb nebulizers, PRN albuterol nebulizers -Nasal cannula oxygen to maintain oxygen saturation above 93% CT chest ordered and pending to rule out PE/pneumonia/further evaluate patient's pulmonary nodule seen on previous CAT scan.  Chronic diastolic CHF: 2D echo on 11/01/2017 showed EF of 50- 60%.  Patient has elevated BNP 983, crackles on auscultation, positive JVD, consistent with mild CHF exacerbation. -Patient received 1 dose of Lasix 40 mg in the ED,  continue Lasix  Due to BNP of 983 -Continue metoprolol and aspirin  HTN (hypertension):  -Switch Lotrel to amlodipine due to hyponatremia -Continue metoprolol -IV hydralazine.  PAD: s/bilateral femoralp of atherectomy. -Continue cilostazol  Diabetes mellitus with peripheral artery disease (HCC): Last A1c not on record. Patient is taking glipizide at home -SSI -A1c  GERD: -Pepcid  Hyponatremia: Na 129.  Mental  status normal.   BNP elevated could be secondary to volume overload -check TSH -Repeat p.m. every morning  Tobacco abuse: -nicotine patch

## 2018-06-06 NOTE — Evaluation (Signed)
Physical Therapy Evaluation Patient Details Name: Alexandria Coffey MRN: [id number] DOB: [birth year] Today's Date: 06/06/2018   History of Present Illness  Pt is a 67 y.o. female admitted 06/05/18 with syncope and fall sustaining L ankle pain. Ankle xray negative for fx. CT head negative for acute changes. PMH includes pulmonary fibrosis, chronic respiratory failure (2L O2 home), dCHF, HTN, DM, GERD, PVD, cocaine abuse, tobacco abuse, depression.    Clinical Impression  Pt presents with an overall decrease in functional mobility secondary to above. PTA, pt indep and lives with daughter who works. Today, pt ambulating at supervision-level with use of RW to offload L ankle pain. Also noted to have decreased activity tolerance, easily fatigued by minimal activity. SpO2 >90% on 2L O2 [place]. Educ on energy conservation, fall risk reduction, and use of RW temporarily for pain and stability. Pt would benefit from continued acute PT services to maximize functional mobility and independence prior to d/c with HHPT services.     Follow Up Recommendations Home health PT;Supervision - Intermittent    Equipment Recommendations  Rolling walker with 5" wheels    Recommendations for Other Services       Precautions / Restrictions Precautions Precautions: Fall Restrictions Weight Bearing Restrictions: No      Mobility  Bed Mobility               General bed mobility comments: Seated at sink with OT upon entering room  Transfers Overall transfer level: Needs assistance Equipment used: None;Rolling walker (2 wheeled) Transfers: Sit to/from Stand Sit to Stand: Min guard         General transfer comment: Reliant on UE support of sink when standing from [name] in order to offload L ankle pain. Supervision with sit<>stand using RW  Ambulation/Gait Ambulation/Gait assistance: Supervision Gait Distance (Feet): 15 Feet Assistive device: Rolling walker (2 wheeled) Gait Pattern/deviations: Step-to  pattern;Decreased weight shift to left;Antalgic Gait velocity: Decreased   General Gait Details: Slow, antalgic amb with RW; reliant on BUE to offload L foot, but able to WB some through LLE. Supervision for balance/technique  Stairs            Wheelchair Mobility    Modified Rankin (Stroke Patients Only)       Balance Overall balance assessment: Needs assistance Sitting-balance support: Feet supported Sitting balance-[name]: Good     Standing balance support: During functional activity;Single extremity supported Standing balance-[name]: Fair Standing balance comment: able to maintain static standing without UE support and minguard for safety; reliant on UE support for dynamic balance                              Pertinent Vitals/Pain Pain Assessment: Faces Faces Pain Scale: Hurts even more Pain Location: L ankle with ROM/mobility  Pain Descriptors / Indicators: Grimacing;Guarding;Sore Pain Intervention(s): Monitored during session;Limited activity within patient's tolerance    Home Living Family/patient expects to be discharged to:: Private residence Living Arrangements: Children Available Help at Discharge: Family;Available PRN/intermittently Type of Home: House Home Access: Level entry     Home Layout: One level Home Equipment: None Additional Comments: Daughter works during day    Prior Function Level of Independence: Independent         Comments: Drives     Hand Dominance   Dominant Hand: Right    Extremity/Trunk Assessment   Upper Extremity Assessment Upper Extremity Assessment: Overall WFL for tasks assessed    Lower Extremity Assessment  Lower Extremity Assessment: LLE deficits/detail LLE Deficits / Details: AROM/PROM limited by c/o pain on dorsum of foot/anterior ankle LLE: Unable to fully assess due to pain    Cervical / Trunk Assessment Cervical / Trunk Assessment: Normal  Communication   Communication: No  difficulties  Cognition Arousal/Alertness: Awake/alert Behavior During Therapy: WFL for tasks assessed/performed Overall Cognitive Status: Within Functional Limits for tasks assessed                                        General Comments General comments (skin integrity, edema, etc.): SpO2 92% on 2L O2 [name]    Exercises General Exercises - Lower Extremity Ankle Circles/Pumps: AROM;Both;5 reps;Seated Long Arc Quad: AROM;Both;10 reps;Seated Hip Flexion/Marching: AROM;Both;10 reps;Seated   Assessment/Plan    PT Assessment Patient needs continued PT services  PT Problem List Decreased range of motion;Decreased activity tolerance;Decreased balance;Decreased mobility       PT Treatment Interventions DME instruction;Gait training;Functional mobility training;Therapeutic activities;Therapeutic exercise;Balance training;Patient/family education    PT Goals (Current goals can be found in the Care Plan section)  Acute Rehab PT Goals Patient Stated Goal: Return home with less pain PT Goal Formulation: With patient Time For Goal Achievement: 06/20/18 Potential to Achieve Goals: Good    Frequency Min 3X/week   Barriers to discharge        Co-evaluation               AM-PAC PT "6 Clicks" Daily Activity  Outcome Measure Difficulty turning over in bed (including adjusting bedclothes, sheets and blankets)?: None Difficulty moving from lying on back to sitting on the side of the bed? : None Difficulty sitting down on and standing up from a chair with arms (e.g., wheelchair, bedside commode, etc,.)?: A Little Help needed moving to and from a bed to chair (including a wheelchair)?: A Little Help needed walking in hospital room?: A Little Help needed climbing 3-5 steps with a railing? : A Little 6 Click Score: 20    End of Session Equipment Utilized During Treatment: Gait belt;Oxygen Activity Tolerance: Patient tolerated treatment well;Patient limited by  pain Patient left: in chair;with call bell/phone within reach;with chair alarm set Nurse Communication: Mobility status PT Visit Diagnosis: Other abnormalities of gait and mobility (R26.89);Pain Pain - Right/Left: Left Pain - part of body: Ankle and joints of foot    Time: 1140-1155 PT Time Calculation (min) (ACUTE ONLY): 15 min   Charges:   PT Evaluation $PT Eval Moderate Complexity: 1 Mod     PT G Codes:       Ina Homes, PT, DPT Acute Rehab Services  Pager: [phone]  Malachy Chamber 06/06/2018, 1:02 PM

## 2018-06-06 NOTE — ED Notes (Signed)
Attempted report to the floor, also spoke with daughter to update her to bed status

## 2018-06-06 NOTE — ED Notes (Signed)
Pt's daughter: Ardis Hughs [phone]

## 2018-06-06 NOTE — Progress Notes (Signed)
  Echocardiogram 2D Echocardiogram has been performed.  Eulan Heyward G Paityn Balsam 06/06/2018, 3:00 PM

## 2018-06-06 NOTE — Plan of Care (Signed)
67 yo F on chronic home O2 for pulmonary fibrosis, also was started on lasix earlier this month by PCP.  Hasnt taken lasix all month.  Today wasn't on her O2 for a couple of hours for some reason.  Had syncope.  O2 sats in the 70s on room air.  In the ED CXR shows no acute findings.  BNP 983 so EDP gave Lasix.  Satting in the 90s now on her baseline 2L.  EDP wants to admit patient over for more diuresis.

## 2018-06-06 NOTE — ED Notes (Signed)
Pt given cheese, crackers, and gingerale per MD.
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
## 2018-06-06 NOTE — Evaluation (Signed)
Occupational Therapy Evaluation Patient Details Name: Alexandria Coffey MRN: [id number] DOB: [birth year] Today's Date: 06/06/2018    History of Present Illness 67 y.o. female with medical history significant of pulmonary fibrosis, chronic respiratory failure on 2 L nasal cannula oxygen at home, dCHF, hypertension, hyperlipidemia, diabetes mellitus, GERD, PVD, cocaine abuse, tobacco abuse, depression, who presents with syncope and L ankel injury, imaging negative for fx, CT of head negative for acute changes.    Clinical Impression   This 67 y/o female presents with the above. At baseline pt is independent with ADLs and functional mobility, lives with family. Pt requiring minA (HHA) for room level functional mobility this session. Demonstrates standing grooming and UB ADL with minguard assist; requiring minguard-minA for LB bathing/dressing ADLs. Pt mostly limited by pain in LLE this session; on 2L O2 during session with lowest SpO2 86% with room level activity, quickly rebounding to 94% with deep breathing techniques and overall remaining above 92%. Pt will benefit from continued acute OT services and recommend follow HHOT services to maximize her safety and independence with ADLs and mobility.     Follow Up Recommendations  Home health OT;Supervision - Intermittent    Equipment Recommendations  3 in 1 bedside commode           Precautions / Restrictions Precautions Precautions: Fall Restrictions Weight Bearing Restrictions: No      Mobility Bed Mobility               General bed mobility comments: Pt sitting EOB upon entering room.   Transfers Overall transfer level: Needs assistance Equipment used: 1 person hand held assist;None Transfers: Sit to/from Stand Sit to Stand: Min guard         General transfer comment: pt inititally seeking HHA for steadying assist upon sit<>stand from EOB; minguard for additional sit<>stands from [facility]     Balance Overall balance assessment:  Needs assistance Sitting-balance support: Feet supported Sitting balance-[name]: Good     Standing balance support: During functional activity;Single extremity supported Standing balance-[name]: Fair Standing balance comment: able to maintain static standing without UE support and minguard for safety; reliant on UE support for dynamic balance                            ADL either performed or assessed with clinical judgement   ADL Overall ADL's : Needs assistance/impaired Eating/Feeding: Independent;Sitting   Grooming: Min guard;Standing;Oral care;Wash/dry face Grooming Details (indicate cue type and reason): close minguard for standing balance  Upper Body Bathing: Min guard;Sitting   Lower Body Bathing: Minimal assistance;Sit to/from stand Lower Body Bathing Details (indicate cue type and reason): pt washing buttocks and perineal region in standing with minA for standing balance and gown management  Upper Body Dressing : Min guard;Sitting Upper Body Dressing Details (indicate cue type and reason): doffing/donning new gown  Lower Body Dressing: Min guard;Sit to/from stand Lower Body Dressing Details (indicate cue type and reason): pt donning mesh underwear with minguard for safety during sit<>stand  Toilet Transfer: Minimal assistance;Ambulation;BSC Toilet Transfer Details (indicate cue type and reason): HHA, BSC in front of sink  Toileting- Clothing Manipulation and Hygiene: Min guard;Sit to/from stand       Functional mobility during ADLs: Minimal assistance General ADL Comments: MinA (HHA) due to LLE pain with mobility. Pt on 2L O2 during session with SpO2 overall remaining above 92%, one instance of SpO2 dropping to 86%, increasing to 94% with deep breathing  techniques                         Pertinent Vitals/Pain Pain Assessment: Faces Faces Pain Scale: Hurts even more Pain Location: L ankle with mobility  Pain Descriptors / Indicators:  Grimacing;Guarding;Sore Pain Intervention(s): Monitored during session;Repositioned     Hand Dominance Right   Extremity/Trunk Assessment Upper Extremity Assessment Upper Extremity Assessment: Overall WFL for tasks assessed   Lower Extremity Assessment Lower Extremity Assessment: Defer to PT evaluation       Communication Communication Communication: No difficulties   Cognition Arousal/Alertness: Awake/alert Behavior During Therapy: WFL for tasks assessed/performed Overall Cognitive Status: Within Functional Limits for tasks assessed                                     General Comments       Exercises     Shoulder Instructions      Home Living Family/patient expects to be discharged to:: Private residence Living Arrangements: Children Available Help at Discharge: Family;Available PRN/intermittently Type of Home: House Home Access: Level entry     Home Layout: One level     Bathroom Shower/Tub: Chief Strategy Officer: Standard     Home Equipment: None          Prior Functioning/Environment Level of Independence: Independent                 OT Problem List: Impaired balance (sitting and/or standing);Decreased activity tolerance;Pain;Cardiopulmonary status limiting activity      OT Treatment/Interventions: Self-care/ADL training;DME and/or AE instruction;Therapeutic activities;Balance training;Therapeutic exercise;Patient/family education;Energy conservation    OT Goals(Current goals can be found in the care plan section) Acute Rehab OT Goals Patient Stated Goal: less pain, regain independence  OT Goal Formulation: With patient Time For Goal Achievement: 06/20/18 Potential to Achieve Goals: Good  OT Frequency: Min 2X/week   Barriers to D/C:            Co-evaluation              AM-PAC PT "6 Clicks" Daily Activity     Outcome Measure Help from another person eating meals?: None Help from another person  taking care of personal grooming?: None Help from another person toileting, which includes using toliet, bedpan, or urinal?: A Little Help from another person bathing (including washing, rinsing, drying)?: A Little Help from another person to put on and taking off regular upper body clothing?: None Help from another person to put on and taking off regular lower body clothing?: A Little 6 Click Score: 21   End of Session Equipment Utilized During Treatment: Gait belt;Oxygen Nurse Communication: Mobility status  Activity Tolerance: Patient tolerated treatment well Patient left: Other (comment)(handoff to PT for continuation of session )  OT Visit Diagnosis: Unsteadiness on feet (R26.81);Pain Pain - Right/Left: Left Pain - part of body: Ankle and joints of foot                Time: 1114-1140 OT Time Calculation (min): 26 min Charges:  OT General Charges $OT Visit: 1 Visit OT Evaluation $OT Eval Moderate Complexity: 1 Mod OT Treatments $Self Care/Home Management : 8-22 mins G-Codes:     Marcy Siren, OT Pager [phone] 06/06/2018   Orlando Penner 06/06/2018, 12:09 PM

## 2018-06-06 NOTE — Progress Notes (Signed)
Initial Nutrition Assessment  DOCUMENTATION CODES:   Not applicable  INTERVENTION:    Glucerna Shake po TID, each supplement provides 220 kcal and 10 grams of protein   D/C Ensure Enlive  NUTRITION DIAGNOSIS:   Increased nutrient needs related to chronic illness as evidenced by estimated needs  GOAL:   Patient will meet greater than or equal to 90% of their needs  MONITOR:   PO intake, Supplement acceptance, Labs, Skin, Weight trends, I & O's  REASON FOR ASSESSMENT:   Malnutrition Screening Tool  ASSESSMENT:   67 y.o. Female Pulm HTN, COPD on 2LNC, diastolic heart failure on Lasix who presented to ED for syncopal episode, after a cough she became unconscious. She stopped taking Lasix one week ago because of generalized weakness and fatigue.  RD met with pt today. She is sitting in her recliner. Reports her appetite is doing better. Eating her grilled cheese. She states her appetite "comes and goes;" was eating 1 meal per day PTA.  Noted pt had unopened Ensure Enlive supplement on her window sill. She has not tried it, however, she is requesting Glucerna instead given DM. Labs and medications reviewed. Na 129 (L). Mg 1.4 (L).  Pt also reveals she's lost approximately 8-10 lbs in the last 1-2 month period. Per readings below, pt's weight has been overall stable since 01/2018. Nutrition Focused Physical Exam deferred at this time.  Diet Order:   Diet Order           Diet heart healthy/carb modified Room service appropriate? Yes; Fluid consistency: Thin  Diet effective now        1 EDUCATION NEEDS:   No education needs have been identified at this time  Skin:  Skin Assessment: Reviewed RN Assessment  Last BM:  N/A  Height:   Ht Readings from Last 1 Encounters:  06/05/18 '5\' 2"'$  (1.575 m)   Weight:   Wt Readings from Last 1 Encounters:  06/05/18 118 lb (53.5 kg)   Wt Readings from Last 10 Encounters:  06/05/18 118 lb (53.5 kg)  04/16/18 118 lb (53.5  kg)  02/19/18 118 lb (53.5 kg)  01/29/18 115 lb (52.2 kg)  11/10/17 119 lb (54 kg)  11/07/17 125 lb 10.6 oz (57 kg)  06/17/17 112 lb (50.8 kg)  09/02/16 117 lb 8 oz (53.3 kg)  03/30/16 119 lb 0.8 oz (54 kg)  07/01/15 120 lb 9.5 oz (54.7 kg)   Ideal Body Weight:  50 kg  BMI:  Body mass index is 21.58 kg/m.  Estimated Nutritional Needs:   Kcal:  1500-1700  Protein:  70-85 gm  Fluid:  1.5-1.7 L  Arthur Holms, RD, LDN Pager #: [phone] After-Hours Pager #: [phone]

## 2018-06-07 DIAGNOSIS — E1151 Type 2 diabetes mellitus with diabetic peripheral angiopathy without gangrene: Secondary | ICD-10-CM | POA: Diagnosis not present

## 2018-06-07 DIAGNOSIS — F488 Other specified nonpsychotic mental disorders: Secondary | ICD-10-CM

## 2018-06-07 DIAGNOSIS — R55 Syncope and collapse: Secondary | ICD-10-CM | POA: Diagnosis not present

## 2018-06-07 DIAGNOSIS — I50813 Acute on chronic right heart failure: Secondary | ICD-10-CM | POA: Diagnosis not present

## 2018-06-07 LAB — MAGNESIUM: Magnesium: 1.9 mg/dL (ref 1.7–2.4)

## 2018-06-07 LAB — RAPID URINE DRUG SCREEN, HOSP PERFORMED
Amphetamines: NOT DETECTED
Benzodiazepines: NOT DETECTED
Cocaine: POSITIVE — AB
Opiates: NOT DETECTED
Tetrahydrocannabinol: NOT DETECTED

## 2018-06-07 LAB — COMPREHENSIVE METABOLIC PANEL
ALT: 12 U/L (ref 0–44)
ANION GAP: 11 (ref 5–15)
AST: 23 U/L (ref 15–41)
Albumin: 2.4 g/dL — ABNORMAL LOW (ref 3.5–5.0)
Alkaline Phosphatase: 97 U/L (ref 38–126)
BUN: 16 mg/dL (ref 8–23)
CHLORIDE: 91 mmol/L — AB (ref 98–111)
CO2: 32 mmol/L (ref 22–32)
CREATININE: 1.25 mg/dL — AB (ref 0.44–1.00)
Calcium: 8.1 mg/dL — ABNORMAL LOW (ref 8.9–10.3)
GFR, EST AFRICAN AMERICAN: 51 mL/min — AB (ref >60)
GFR, EST NON AFRICAN AMERICAN: 44 mL/min — AB (ref >60)
Glucose, Bld: 295 mg/dL — ABNORMAL HIGH (ref 70–99)
POTASSIUM: 4.3 mmol/L (ref 3.5–5.1)
SODIUM: 134 mmol/L — AB (ref 135–145)
Total Bilirubin: 0.5 mg/dL (ref 0.3–1.2)
Total Protein: 6.7 g/dL (ref 6.5–8.1)

## 2018-06-07 LAB — CBC
HEMATOCRIT: 33.4 % — AB (ref 36.0–46.0)
HEMOGLOBIN: 9.7 g/dL — AB (ref 12.0–15.0)
MCH: 20.2 pg — AB (ref 26.0–34.0)
MCHC: 29 g/dL — ABNORMAL LOW (ref 30.0–36.0)
MCV: 69.6 fL — AB (ref 78.0–100.0)
PLATELETS: 309 10*3/uL (ref 150–400)
RBC: 4.8 MIL/uL (ref 3.87–5.11)
RDW: 21.4 % — ABNORMAL HIGH (ref 11.5–15.5)
WBC: 5.7 10*3/uL (ref 4.0–10.5)

## 2018-06-07 LAB — GLUCOSE, CAPILLARY
GLUCOSE-CAPILLARY: 209 mg/dL — AB (ref 70–99)
Glucose-Capillary: 150 mg/dL — ABNORMAL HIGH (ref 70–99)
Glucose-Capillary: 229 mg/dL — ABNORMAL HIGH (ref 70–99)

## 2018-06-07 LAB — TSH: TSH: 1.234 u[IU]/mL (ref 0.350–4.500)

## 2018-06-07 MED ORDER — ORAL CARE MOUTH RINSE
15.0000 mL | Freq: Two times a day (BID) | OROMUCOSAL | Status: DC
  Administered 2018-06-07: 15 mL via OROMUCOSAL

## 2018-06-07 MED ORDER — FUROSEMIDE 20 MG PO TABS: 40.0000 mg | ORAL_TABLET | Freq: Every day | ORAL | 1 refills | Status: DC

## 2018-06-07 MED ORDER — AMLODIPINE BESY-BENAZEPRIL HCL 5-20 MG PO CAPS: 1.0000 | ORAL_CAPSULE | Freq: Every day | ORAL | 0 refills | Status: AC

## 2018-06-07 MED ORDER — POLYETHYLENE GLYCOL 3350 17 G PO PACK: 17.0000 g | PACK | Freq: Every day | ORAL | 0 refills | Status: DC | PRN

## 2018-06-07 MED ORDER — MAGNESIUM OXIDE 400 (241.3 MG) MG PO TABS: 400.0000 mg | ORAL_TABLET | Freq: Two times a day (BID) | ORAL | 1 refills | Status: DC

## 2018-06-07 MED ORDER — CARVEDILOL 3.125 MG PO TABS: 6.2500 mg | ORAL_TABLET | Freq: Two times a day (BID) | ORAL | 11 refills | Status: DC

## 2018-06-07 MED ORDER — MAGNESIUM OXIDE 400 (241.3 MG) MG PO TABS
400.0000 mg | ORAL_TABLET | Freq: Two times a day (BID) | ORAL | Status: DC
  Administered 2018-06-07: 400 mg via ORAL
  Filled 2018-06-07: qty 1

## 2018-06-07 MED ORDER — GLUCERNA SHAKE PO LIQD: 237.0000 mL | Freq: Three times a day (TID) | ORAL | 0 refills | Status: DC

## 2018-06-07 MED ORDER — NICOTINE 21 MG/24HR TD PT24: 21.0000 mg | MEDICATED_PATCH | Freq: Every day | TRANSDERMAL | 0 refills | Status: DC

## 2018-06-07 NOTE — Progress Notes (Signed)
Physical Therapy Treatment Patient Details Name: Alexandria Coffey MRN: [id number] DOB: Jul 02, 1951 Today's Date: 06/07/2018    History of Present Illness Pt is a 67 y.o. female admitted 06/05/18 with syncope and fall sustaining L ankle pain. Ankle xray negative for fx. CT head negative for acute changes. PMH includes pulmonary fibrosis, chronic respiratory failure (2L O2 home), dCHF, HTN, DM, GERD, PVD, cocaine abuse, tobacco abuse, depression.   PT Comments    Pt progressing well with mobility. Mod indep ambulating short distance with RW; limited by c/o L ankle pain. Educ on fall risk reduction, rest, elevation and ice application, in addition to ROM/mobility within pain tolerance. Pt ready to d/c home this evening. Has met short-term acute PT goals. Has no further questions or concerns. Will d/c acute PT.   Follow Up Recommendations  Home health PT;Supervision - Intermittent     Equipment Recommendations  Rolling walker with 5" wheels    Recommendations for Other Services       Precautions / Restrictions Precautions Precautions: Fall Restrictions Weight Bearing Restrictions: No    Mobility  Bed Mobility Overal bed mobility: Independent                Transfers Overall transfer level: Modified independent Equipment used: Rolling walker (2 wheeled) Transfers: Sit to/from Stand              Ambulation/Gait Ambulation/Gait assistance: Modified independent (Device/Increase time) Gait Distance (Feet): 30 Feet Assistive device: Rolling walker (2 wheeled) Gait Pattern/deviations: Step-to pattern;Decreased weight shift to left;Antalgic Gait velocity: Decreased Gait velocity interpretation: 1.31 - 2.62 ft/sec, indicative of limited community ambulator General Gait Details: Slow, antalgic amb with RW; reliant on BUE to offload L foot, but able to WB some through LLE.    Stairs             Wheelchair Mobility    Modified Rankin (Stroke Patients Only)        Balance Overall balance assessment: Needs assistance Sitting-balance support: Feet supported Sitting balance-[name]: Good     Standing balance support: During functional activity;Single extremity supported Standing balance-[name]: Fair Standing balance comment: able to maintain static standing without UE support and minguard for safety; reliant on UE support for dynamic balance                             Cognition Arousal/Alertness: Awake/alert Behavior During Therapy: WFL for tasks assessed/performed Overall Cognitive Status: Within Functional Limits for tasks assessed                                        Exercises Other Exercises Other Exercises: Educ on rest/ice/elevation for ankle swelling; educ on ankle mobility/ROM within pain tolerance    General Comments        Pertinent Vitals/Pain Pain Assessment: Faces Faces Pain Scale: Hurts a little bit Pain Location: L ankle with ROM/mobility  Pain Descriptors / Indicators: Grimacing;Guarding;Sore Pain Intervention(s): Monitored during session;Limited activity within patient's tolerance    Home Living                      Prior Function            PT Goals (current goals can now be found in the care plan section) Acute Rehab PT Goals Patient Stated Goal: Return home with less pain PT Goal  Formulation: With patient Time For Goal Achievement: 06/20/18 Potential to Achieve Goals: Good Progress towards PT goals: Goals met/education completed, patient discharged from PT    Frequency    Min 3X/week      PT Plan Current plan remains appropriate    Co-evaluation              AM-PAC PT "6 Clicks" Daily Activity  Outcome Measure  Difficulty turning over in bed (including adjusting bedclothes, sheets and blankets)?: None Difficulty moving from lying on back to sitting on the side of the bed? : None Difficulty sitting down on and standing up from a chair with  arms (e.g., wheelchair, bedside commode, etc,.)?: None Help needed moving to and from a bed to chair (including a wheelchair)?: A Little Help needed walking in hospital room?: A Little Help needed climbing 3-5 steps with a railing? : A Little 6 Click Score: 21    End of Session Equipment Utilized During Treatment: Gait belt;Oxygen Activity Tolerance: Patient tolerated treatment well;Patient limited by pain Patient left: in bed;with call bell/phone within reach Nurse Communication: Mobility status PT Visit Diagnosis: Other abnormalities of gait and mobility (R26.89);Pain Pain - Right/Left: Left Pain - part of body: Ankle and joints of foot     Time: 7573-2256 PT Time Calculation (min) (ACUTE ONLY): 12 min  Charges:  $Self Care/Home Management: 8-22                    G Codes:      Mabeline Caras, PT, DPT Acute Rehab Services  Pager: [place] 06/07/2018, 5:22 PM

## 2018-06-07 NOTE — Progress Notes (Signed)
CSW met with pt and provided list of outpatient and inpatient alcohol/drug rehab programs.  Pt has been to [facility] in the past and is not interested in returning there.  CSW discussed other local options and informed pt she would need to call and do phone assessment with them to discuss possible admission.  Patient reports no other questions at this time- CSW signing off  Jorge Ny, [place] Social Worker [phone]

## 2018-06-07 NOTE — Discharge Summary (Signed)
Physician Discharge Summary  Alexandria Coffey MRN: [id number] DOB/AGE: 1951-09-03 67 y.o.  PCP: Nolene Ebbs, MD   Admit date: 06/05/2018 Discharge date: 06/07/2018  Discharge Diagnoses:    Principal Problem:   Syncope Active Problems:   Diabetes mellitus with peripheral artery disease (HCC)   PAD (peripheral artery disease) (HCC)   HTN (hypertension)   GERD (gastroesophageal reflux disease)   Chronic diastolic CHF (congestive heart failure) (HCC)   Pulmonary fibrosis (HCC)   Chronic respiratory failure with hypoxia (HCC)   Hyponatremia   Tobacco abuse   Fall    Follow-up recommendations Follow-up with PCP in 3-5 days , including all  additional recommended appointments as below Follow-up CBC, CMP in 3-5 days Metoprolol switched to Coreg because of ongoing cocaine use Patient discharged with home health PT/OT     Allergies as of 06/07/2018      Reactions   Lipitor [atorvastatin] Other (See Comments)   Muscle pain      Medication List    STOP taking these medications   ibuprofen 800 MG tablet Commonly known as:  ADVIL,MOTRIN   metoprolol tartrate 25 MG tablet Commonly known as:  LOPRESSOR     TAKE these medications   amitriptyline 50 MG tablet Commonly known as:  ELAVIL Take 50 mg by mouth at bedtime.   amLODipine-benazepril 5-20 MG capsule Commonly known as:  LOTREL Take 1 capsule by mouth daily.   aspirin 81 MG EC tablet Take 1 tablet (81 mg total) by mouth daily.   BREO ELLIPTA 100-25 MCG/INH Aepb Generic drug:  fluticasone furoate-vilanterol Inhale 1 puff into the lungs daily.   carvedilol 3.125 MG tablet Commonly known as:  COREG Take 2 tablets (6.25 mg total) by mouth 2 (two) times daily.   cilostazol 50 MG tablet Commonly known as:  PLETAL Take 50 mg by mouth 2 (two) times daily.   feeding supplement (GLUCERNA SHAKE) Liqd Take 237 mLs by mouth 3 (three) times daily between meals.   furosemide 20 MG tablet Commonly known as:  LASIX Take  2 tablets (40 mg total) by mouth daily. What changed:    how much to take  when to take this  reasons to take this   glipiZIDE 10 MG 24 hr tablet Commonly known as:  GLUCOTROL XL Take 1 tablet (10 mg total) by mouth daily.   guaiFENesin-dextromethorphan 100-10 MG/5ML syrup Commonly known as:  ROBITUSSIN DM Take 5 mLs by mouth every 4 (four) hours as needed for cough.   hydrOXYzine 10 MG tablet Commonly known as:  ATARAX/VISTARIL Take 1 tablet (10 mg total) by mouth every 6 (six) hours as needed (For itiching).   ipratropium-albuterol 0.5-2.5 (3) MG/3ML Soln Commonly known as:  DUONEB Take 3 mLs by nebulization every 2 (two) hours as needed.   magnesium oxide 400 (241.3 Mg) MG tablet Commonly known as:  MAG-OX Take 1 tablet (400 mg total) by mouth 2 (two) times daily.   nicotine 21 mg/24hr patch Commonly known as:  NICODERM CQ - dosed in mg/24 hours Place 1 patch (21 mg total) onto the skin daily.   polyethylene glycol packet Commonly known as:  MIRALAX / GLYCOLAX Take 17 g by mouth daily as needed for mild constipation.   ranitidine 150 MG tablet Commonly known as:  ZANTAC Take 150 mg by mouth 2 (two) times daily.            Durable Medical Equipment  (From admission, onward)        Start  Ordered   06/07/18 0758  For home use only DME Walker rolling  Once    Question:  Patient needs a walker to treat with the following condition  Answer:  Syncope   06/07/18 0758       Discharge Condition: * stable Discharge Instructions Get Medicines reviewed and adjusted: Please take all your medications with you for your next visit with your Primary MD  Please request your Primary MD to go over all hospital tests and procedure/radiological results at the follow up, please ask your Primary MD to get all Hospital records sent to his/her office.  If you experience worsening of your admission symptoms, develop shortness of breath, life threatening emergency,  suicidal or homicidal thoughts you must seek medical attention immediately by calling 911 or calling your MD immediately  if symptoms less severe.  You must read complete instructions/literature along with all the possible adverse reactions/side effects for all the Medicines you take and that have been prescribed to you. Take any new Medicines after you have completely understood and accpet all the possible adverse reactions/side effects.   Do not drive when taking Pain medications.   Do not take more than prescribed Pain, Sleep and Anxiety Medications  Special Instructions: If you have smoked or chewed Tobacco  in the last 2 yrs please stop smoking, stop any regular Alcohol  and or any Recreational drug use.  Wear Seat belts while driving.  Please note  You were cared for by a hospitalist during your hospital stay. Once you are discharged, your primary care physician will handle any further medical issues. Please note that NO REFILLS for any discharge medications will be authorized once you are discharged, as it is imperative that you return to your primary care physician (or establish a relationship with a primary care physician if you do not have one) for your aftercare needs so that they can reassess your need for medications and monitor your lab values.     Allergies  Allergen Reactions  . Lipitor [Atorvastatin] Other (See Comments)    Muscle pain       Disposition: Discharge disposition: 01-Home or Self Care        Consults:   None    Significant Diagnostic Studies:  Dg Chest 2 View  Result Date: 06/06/2018 CLINICAL DATA:  Shortness of breath EXAM: CHEST - 2 VIEW COMPARISON:  04/16/2018 FINDINGS: Chronic lung disease with coarse interstitial opacities. Chronic volume loss asymmetric to the left with diaphragm elevation. Cardiomegaly. Status post median sternotomy. Stable metallic debris at the medial left clavicle. IMPRESSION: 1. No acute finding when compared to  prior. 2. Pulmonary fibrosis. Electronically Signed   By: Monte Fantasia M.D.   On: 06/06/2018 00:34   Dg Ankle 2 Views Left  Result Date: 06/06/2018 CLINICAL DATA:  67 year old female status post fall. EXAM: LEFT ANKLE - 2 VIEW COMPARISON:  None. FINDINGS: Bone mineralization is within normal limits for age. Mortise joint alignment within normal limits. No acute osseous abnormality identified. Calcified peripheral vascular disease. No discrete soft tissue injury. IMPRESSION: No acute fracture or dislocation identified about the left ankle. Electronically Signed   By: Genevie Ann M.D.   On: 06/06/2018 07:27   Ct Head Wo Contrast  Result Date: 06/06/2018 CLINICAL DATA:  Syncopal episode yesterday. History of hepatitis C, substance abuse, hypertension and hypercholesterolemia. EXAM: CT HEAD WITHOUT CONTRAST TECHNIQUE: Contiguous axial images were obtained from the base of the skull through the vertex without intravenous contrast. COMPARISON:  CT HEAD  November 05, 2017 FINDINGS: BRAIN: No intraparenchymal hemorrhage, mass effect nor midline shift. Moderate disproportionate vermian atrophy. No hydrocephalus. Patchy supratentorial white matter hypodensities within normal range for patient's age, though non-specific are most compatible with chronic small vessel ischemic disease. No acute large vascular territory infarcts. No abnormal extra-axial fluid collections. Basal cisterns are patent. VASCULAR: Moderate calcific atherosclerosis of the carotid siphons. SKULL: No skull fracture. No significant scalp soft tissue swelling. SINUSES/ORBITS: The mastoid air-cells and included paranasal sinuses are well-aerated.The included ocular globes and orbital contents are non-suspicious. Status post bilateral ocular lens implants. OTHER: None. IMPRESSION: 1. No acute intracranial process. 2. Moderate disproportionate vermian atrophy. Electronically Signed   By: Elon Alas M.D.   On: 06/06/2018 06:58   Ct Angio Chest Pe W  Or Wo Contrast  Result Date: 06/06/2018 CLINICAL DATA:  67 year old female with syncope yesterday. Shortness of breath, chest pain. EXAM: CT ANGIOGRAPHY CHEST WITH CONTRAST TECHNIQUE: Multidetector CT imaging of the chest was performed using the standard protocol during bolus administration of intravenous contrast. Multiplanar CT image reconstructions and MIPs were obtained to evaluate the vascular anatomy. CONTRAST:  125m ISOVUE-370 IOPAMIDOL (ISOVUE-370) INJECTION 76% COMPARISON:  Chest CT 02/08/2018 and chest CTA 10/31/2017. FINDINGS: Cardiovascular: Good contrast bolus timing in the pulmonary arterial tree. Chronic central pulmonary artery enlargement. Mild respiratory motion at the right lung base. No focal filling defect identified in the pulmonary arteries to suggest acute pulmonary embolism. Central venous stenosis in the superior mediastinum. The left upper extremity injection opacifies numerous paravertebral venous collaterals, the azygos and hemi azygous system, and a portion of the right subclavian vein. Prior sternotomy. Calcified aortic atherosclerosis. Chronic cardiomegaly. No pericardial effusion. Mediastinum/Nodes: Stable nonspecific 12-14 millimeter prevascular lymph nodes. No other mediastinal lymphadenopathy or abnormality. Lungs/Pleura: Chronic elevation of the left hemidiaphragm with confluent left lower lobe atelectasis. The small layering left pleural effusion in 2018 has resolved. Chronic bronchiectasis and honeycombing in the right lung, better demonstrated on 02/08/2018. Superimposed atelectatic changes to the central airways today. No acute pulmonary opacity identified. Upper Abdomen: Negative visible liver, spleen, pancreas, adrenal glands, left kidney, and bowel in the upper abdomen. Musculoskeletal: Prior sternotomy. No acute osseous abnormality identified. Review of the MIP images confirms the above findings. IMPRESSION: 1. No evidence of acute pulmonary embolus. Large central  pulmonary artery suggesting chronic pulmonary artery hypertension. Chronic thoracic central venous stenosis. Chronic cardiomegaly, Aortic Atherosclerosis (ICD10-I70.0). 2. Chronic lung disease with bronchiectasis and honeycombing in the right lung. Chronic left lower lobe atelectasis. No superimposed acute pulmonary findings. Electronically Signed   By: HGenevie AnnM.D.   On: 06/06/2018 16:07    echocardiogram  LV EF: 55% -   60%  ------------------------------------------------------------------- Indications:      Syncope 780.2.  ------------------------------------------------------------------- History:   PMH:   Congestive heart failure.  PMH:  GERD. Cocaine use.  Risk factors:  Current tobacco use. Hypertension. Diabetes mellitus. Dyslipidemia.  ------------------------------------------------------------------- Study Conclusions  - Left ventricle: Known dextrocardia. The cavity size was normal.   Wall thickness was increased in a pattern of mild LVH. Systolic   function was normal. The estimated ejection fraction was in the   range of 55% to 60%. Wall motion was normal; there were no   regional wall motion abnormalities. Doppler parameters are   consistent with abnormal left ventricular relaxation (grade 1   diastolic dysfunction). - Tricuspid valve: There was moderate regurgitation. - Pulmonary arteries: PA peak pressure: 27 mm Hg (S).  Impressions:  - Dextrocardia. Normal EF without wall  motion abnormalities. No   significant valvular disease.       Filed Weights   06/05/18 2244 06/07/18 0421  Weight: 53.5 kg (118 lb) 55 kg (121 lb 4.1 oz)     Microbiology: No results found for this or any previous visit (from the past 240 hour(s)).     Blood Culture    Component Value Date/Time   SDES BLOOD BLOOD LEFT WRIST 10/31/2017 1540   SDES URINE, CLEAN CATCH 10/31/2017 1540   SPECREQUEST  10/31/2017 1540    BOTTLES DRAWN AEROBIC AND ANAEROBIC Blood Culture  adequate volume   SPECREQUEST NONE 10/31/2017 1540   CULT  10/31/2017 1540    NO GROWTH 5 DAYS Performed at [facility], [place] [address] [place], [place] [address]    CULT (A) 10/31/2017 1540    <10,000 COLONIES/mL INSIGNIFICANT GROWTH Performed at [facility] [address], [place],  [address]    REPTSTATUS 11/05/2017 FINAL 10/31/2017 1540   REPTSTATUS 11/01/2017 FINAL 10/31/2017 1540      Labs: Results for orders placed or performed during the hospital encounter of 06/05/18 (from the past 48 hour(s))  CBC with Differential     Status: Abnormal   Collection Time: 06/05/18 11:40 PM  Result Value Ref Range   WBC 4.7 4.0 - 10.5 K/uL    Comment: WHITE COUNT CONFIRMED ON SMEAR   RBC 4.83 3.87 - 5.11 MIL/uL   Hemoglobin 10.0 (L) 12.0 - 15.0 g/dL   HCT 31.9 (L) 36.0 - 46.0 %   MCV 66.0 (L) 78.0 - 100.0 fL   MCH 20.7 (L) 26.0 - 34.0 pg   MCHC 31.3 30.0 - 36.0 g/dL   RDW 21.1 (H) 11.5 - 15.5 %   Platelets 349 150 - 400 K/uL   Neutrophils Relative % 47 %   Lymphocytes Relative 35 %   Monocytes Relative 16 %   Eosinophils Relative 1 %   Basophils Relative 1 %   Neutro Abs 2.3 1.7 - 7.7 K/uL   Lymphs Abs 1.6 0.7 - 4.0 K/uL   Monocytes Absolute 0.8 0.1 - 1.0 K/uL   Eosinophils Absolute 0.0 0.0 - 0.7 K/uL   Basophils Absolute 0.0 0.0 - 0.1 K/uL   RBC Morphology POLYCHROMASIA PRESENT     Comment: TARGET CELLS RARE NRBCs Performed at [facility], [address] [place], [place] [address]   Comprehensive metabolic panel     Status: Abnormal   Collection Time: 06/05/18 11:40 PM  Result Value Ref Range   Sodium 129 (L) 135 - 145 mmol/L   Potassium 3.6 3.5 - 5.1 mmol/L   Chloride 92 (L) 98 - 111 mmol/L    Comment: Please note change in reference range.   CO2 27 22 - 32 mmol/L   Glucose, Bld 202 (H) 70 - 99 mg/dL    Comment: Please note change in reference range.   BUN 11 8 - 23 mg/dL    Comment: Please note change in reference range.    Creatinine, Ser 0.94 0.44 - 1.00 mg/dL   Calcium 8.0 (L) 8.9 - 10.3 mg/dL   Total Protein 7.6 6.5 - 8.1 g/dL   Albumin 2.7 (L) 3.5 - 5.0 g/dL   AST 21 15 - 41 U/L   ALT 14 0 - 44 U/L    Comment: Please note change in reference range.   Alkaline Phosphatase 111 38 - 126 U/L   Total Bilirubin 0.4 0.3 - 1.2 mg/dL   GFR  calc non Af Amer >60 >60 mL/min   GFR calc Af Amer >60 >60 mL/min    Comment: (NOTE) The eGFR has been calculated using the CKD EPI equation. This calculation has not been validated in all clinical situations. eGFR's persistently <60 mL/min signify possible Chronic Kidney Disease.    Anion gap 10 5 - 15    Comment: Performed at [facility], [address] [place], [place] [address]  Troponin I     Status: None   Collection Time: 06/05/18 11:40 PM  Result Value Ref Range   Troponin I <0.03 <0.03 ng/mL    Comment: Performed at [facility], [address] [place], [place] [address]  Brain natriuretic peptide     Status: Abnormal   Collection Time: 06/05/18 11:40 PM  Result Value Ref Range   B Natriuretic Peptide 983.1 (H) 0.0 - 100.0 pg/mL    Comment: Performed at [facility], [address] [place], [place] [address]  Urinalysis, Routine w reflex microscopic     Status: Abnormal   Collection Time: 06/06/18 12:30 AM  Result Value Ref Range   Color, Urine YELLOW YELLOW   APPearance CLEAR CLEAR   Specific Gravity, Urine <1.005 (L) 1.005 - 1.030   pH 6.0 5.0 - 8.0   Glucose, UA NEGATIVE NEGATIVE mg/dL   Hgb urine dipstick NEGATIVE NEGATIVE   Bilirubin Urine NEGATIVE NEGATIVE   Ketones, ur NEGATIVE NEGATIVE mg/dL   Protein, ur NEGATIVE NEGATIVE mg/dL   Nitrite NEGATIVE NEGATIVE   Leukocytes, UA NEGATIVE NEGATIVE    Comment: Microscopic not done on urines with negative protein, blood, leukocytes, nitrite, or glucose < 500 mg/dL. Performed at [facility], [address] [place], [place] [address]   Rapid urine drug  screen (hospital performed)     Status: Abnormal   Collection Time: 06/06/18  5:27 AM  Result Value Ref Range   Opiates NONE DETECTED NONE DETECTED   Cocaine POSITIVE (A) NONE DETECTED   Benzodiazepines NONE DETECTED NONE DETECTED   Amphetamines NONE DETECTED NONE DETECTED   Tetrahydrocannabinol NONE DETECTED NONE DETECTED   Barbiturates (A) NONE DETECTED    Result not available. Reagent lot number recalled by manufacturer.    Comment: Performed at [facility], [place] [address], [place], [place] [address]  Hemoglobin A1c     Status: Abnormal   Collection Time: 06/06/18  8:07 AM  Result Value Ref Range   Hgb A1c MFr Bld 7.2 (H) 4.8 - 5.6 %    Comment: (NOTE)         Prediabetes: 5.7 - 6.4         Diabetes: >6.4         Glycemic control for adults with diabetes: <7.0    Mean Plasma Glucose 160 mg/dL    Comment: (NOTE) Performed At: [facility] [place], [place] [id number] Rush Farmer MD ON:[id number]   Troponin I     Status: None   Collection Time: 06/06/18  8:14 AM  Result Value Ref Range   Troponin I <0.03 <0.03 ng/mL    Comment: Performed at [facility] [address] [place], [place] [address]  D-dimer, quantitative (not at [facility])     Status: Abnormal   Collection Time: 06/06/18  8:14 AM  Result Value Ref Range   D-Dimer, Quant 1.19 (H) 0.00 - 0.50 ug/mL-FEU    Comment: (NOTE) At the manufacturer cut-off of 0.50  ug/mL FEU, this assay has been documented to exclude PE with a sensitivity and negative predictive value of 97 to 99%.  At this time, this assay has not been approved by the FDA to exclude DVT/VTE. Results should be correlated with clinical presentation. Performed at [facility], [place] [address] [place] [address], [place], [place] [address]   Glucose, capillary     Status: Abnormal   Collection Time: 06/06/18  9:06 AM  Result Value Ref Range   Glucose-Capillary 269 (H) 70 - 99 mg/dL  Glucose, capillary     Status: Abnormal    Collection Time: 06/06/18 11:40 AM  Result Value Ref Range   Glucose-Capillary 57 (L) 70 - 99 mg/dL  Glucose, capillary     Status: Abnormal   Collection Time: 06/06/18 12:16 PM  Result Value Ref Range   Glucose-Capillary 116 (H) 70 - 99 mg/dL  Troponin I     Status: None   Collection Time: 06/06/18  1:53 PM  Result Value Ref Range   Troponin I <0.03 <0.03 ng/mL    Comment: Performed at [facility], [place] [address], [place], [place] [address]  Magnesium     Status: Abnormal   Collection Time: 06/06/18  1:53 PM  Result Value Ref Range   Magnesium 1.4 (L) 1.7 - 2.4 mg/dL    Comment: Performed at [facility] [address] [place], [place] [address]  Glucose, capillary     Status: Abnormal   Collection Time: 06/06/18  4:32 PM  Result Value Ref Range   Glucose-Capillary 200 (H) 70 - 99 mg/dL  Troponin I     Status: None   Collection Time: 06/06/18  9:21 PM  Result Value Ref Range   Troponin I <0.03 <0.03 ng/mL    Comment: Performed at [facility] [address], [place], [place] [address]  Glucose, capillary     Status: Abnormal   Collection Time: 06/06/18  9:23 PM  Result Value Ref Range   Glucose-Capillary 164 (H) 70 - 99 mg/dL  TSH     Status: None   Collection Time: 06/07/18  4:31 AM  Result Value Ref Range   TSH 1.234 0.350 - 4.500 uIU/mL    Comment: Performed by a 3rd Generation assay with a functional sensitivity of <=0.01 uIU/mL. Performed at [facility], [place] [address], [place],  [address]   Comprehensive metabolic panel     Status: Abnormal   Collection Time: 06/07/18  4:31 AM  Result Value Ref Range   Sodium 134 (L) 135 - 145 mmol/L   Potassium 4.3 3.5 - 5.1 mmol/L   Chloride 91 (L) 98 - 111 mmol/L    Comment: Please note change in reference range.   CO2 32 22 - 32 mmol/L   Glucose, Bld 295 (H) 70 - 99 mg/dL    Comment: Please note change in reference range.   BUN 16 8 - 23 mg/dL    Comment: Please note change in reference  range.   Creatinine, Ser 1.25 (H) 0.44 - 1.00 mg/dL   Calcium 8.1 (L) 8.9 - 10.3 mg/dL   Total Protein 6.7 6.5 - 8.1 g/dL   Albumin 2.4 (L) 3.5 - 5.0 g/dL   AST 23 15 - 41 U/L   ALT 12 0 - 44 U/L    Comment: Please note change in reference range.   Alkaline Phosphatase 97 38 - 126 U/L   Total Bilirubin 0.5 0.3 - 1.2 mg/dL  GFR calc non Af Amer 44 (L) >60 mL/min   GFR calc Af Amer 51 (L) >60 mL/min    Comment: (NOTE) The eGFR has been calculated using the CKD EPI equation. This calculation has not been validated in all clinical situations. eGFR's persistently <60 mL/min signify possible Chronic Kidney Disease.    Anion gap 11 5 - 15    Comment: Performed at [facility] [address] [place], [place] [address]  CBC     Status: Abnormal   Collection Time: 06/07/18  4:31 AM  Result Value Ref Range   WBC 5.7 4.0 - 10.5 K/uL   RBC 4.80 3.87 - 5.11 MIL/uL   Hemoglobin 9.7 (L) 12.0 - 15.0 g/dL   HCT 33.4 (L) 36.0 - 46.0 %   MCV 69.6 (L) 78.0 - 100.0 fL   MCH 20.2 (L) 26.0 - 34.0 pg   MCHC 29.0 (L) 30.0 - 36.0 g/dL   RDW 21.4 (H) 11.5 - 15.5 %   Platelets 309 150 - 400 K/uL    Comment: Performed at [facility], [place] [address], [place], [place] [address]  Glucose, capillary     Status: Abnormal   Collection Time: 06/07/18  7:54 AM  Result Value Ref Range   Glucose-Capillary 150 (H) 70 - 99 mg/dL     Lipid Panel  No results found for: CHOL, TRIG, HDL, CHOLHDL, VLDL, LDLCALC, LDLDIRECT   Lab Results  Component Value Date   HGBA1C 7.2 (H) 06/06/2018     Lab Results  Component Value Date   CREATININE 1.25 (H) 06/07/2018     HPI :  As per Dr. Judeth Horn is a 67 y.o. female with medical history significant of pulmonary fibrosis, chronic respiratory failure on 2 L nasal cannula oxygen at home, dCHF, hypertension, hyperlipidemia, diabetes mellitus, GERD, PVD, cocaine abuse, tobacco abuse, depression, who presents with syncope.  Patient had syncope  episode at about 11 PM.  patient does not remember exactly what happened to her, but states that she passed out, fell and injured her left ankle.  She complains of left ankle pain now. Per report, pt did not wear her oxygen for few hours before fall. Per EDP, her grandson reported that the patient was unconscious for approximately 15 seconds. No seizure activity noted.  Patient has generalized weakness.  No unilateral numbness or tingling his extremities.  No facial droop or slurred speech. She moves all extremities normally.  Patient has worsening shortness of breath on exertion and dry cough. She denies any chest pain.  She has nausea, no vomiting, diarrhea or abdominal pain.  She states that she is constipated. Patient reports that she was placed on Lasix 1 month ago by PCP, but stopped taking it 1 week ago on her own.   ED Course: pt was found to have WBC 4.7, negative troponin, BNP 983, negative urinalysis, sodium 129, creatinine normal, temperature normal, heart rate in 90s, oxygen saturation 94% on room air, which improved to 93% on 2 L nasal cannula oxygen, negative chest x-ray.  Patient is placed on telemetry bed of observation.    HOSPITAL COURSE:   Syncopeand fall:differential diagnosis cough syncope versus due to hypoxia from not wearing oxygen/stopping Lasix versus ongoing cocaine use resulting in arrhythmia?. Urine drug screen confirms ongoing use of cocaine.   Another differential diagnosis is orthostatic  hypotension, however orthostatics were negative on admission.No focal neurologic findings on physical examination, less likely to have stroke. No seizure  activity noted. Pt has left ankle injury after fall.  2-D echo  Dextrocardia, normal EF, no significant valvular disease - cycle cardiac enzymes, telemetry shows normal sinus rhythm - magnesium 1.4 repleted and patient started on supplementation - Ct-head no acute intracranial process - X-ray of L ankle no acute fracture or  dislocation Recommend alcohol cessation, that she cessation of cocaine use and smoking  Chronic respiratory failure with hypoxia and pulmonary fibrosis: -DuoNeb nebulizers, PRN albuterol nebulizers -Nasal cannula oxygen to maintain oxygen saturation above 93% CT chest without any evidence of RV embolism,  The artery shows chronic pulmonary hypertension,  Chronic lung disease with bronchiectasis and honeycombing in the right lung and chronic left lower lobe atelectasis, no pneumonia  Chronic diastolic SJG:2E echo on 36/04/2946 showed EF of 50- 60%. Patient has elevated BNP 983, crackles on auscultation, positive JVD, consistent with mild CHF exacerbation. -Patient received 1 dose of Lasix 40 mg in the ED,  given IV Lasix  Due to BNP of 983 -metoprolol switched to Coreg because of ongoing cocaine use Increase Lasix from 20-40 mg daily  HTN (hypertension):  - continue Coreg, Lotrel -IV hydralazine.  PAD:s/bilateral femoralp ofatherectomy. -Continue cilostazol  Diabetes mellitus with peripheral artery disease (HCC):Last A1cnot on record.Patient is takingglipizideat home  hemoglobin A1c 7.2  GERD: -Pepcid  Hyponatremia: Na 129.Mental status normal.   BNP elevated could be secondary to volume overload - TSH 1.234    Tobacco abuse: -nicotine patch     Discharge Exam:   Blood pressure (!) 142/76, pulse 73, temperature (!) 97.5 F (36.4 C), temperature source Oral, resp. rate 19, height '5\' 2"'$  (1.575 m), weight 55 kg (121 lb 4.1 oz), SpO2 100 %.  Cardiac: S1/S2, RRR, No murmurs, No gallops or rubs. Respiratory: has crackles bilaterally. GI: Soft, nondistended, nontender, no rebound pain, no organomegaly, BS present. GU: No hematuria  Ext: has trace leg edema bilaterally. 2+DP/PT pulse bilaterally. Musculoskeletal: No joint deformities, No joint redness or warmth, no limitation of ROM in spin. Skin: No rashes.  Neuro: Alert, oriented X3, cranial nerves II-XII  grossly intact, moves all extremities normally. Muscle strength 5/5 in all extremities, sensation to light touch intact. Psych: Patient is not psychotic, no suicidal or hemocidal ideation.      Follow-up Information    Nolene Ebbs, MD. Call.   Specialty:  Internal Medicine Why:  follow up with pcp in 3-5 days  Contact information: [place] [address] [phone]           Signed: Reyne Dumas 06/07/2018, 8:18 AM      Time needed to  prevent discharge, discussed with the patient and family 35 minutes

## 2018-06-07 NOTE — Care Management Obs Status (Signed)
MEDICARE OBSERVATION STATUS NOTIFICATION   Patient Details  Name: Alexandria Coffey MRN: [id number] Date of Birth: 04-20-1951   Medicare Observation Status Notification Given:  Yes    Leone Haven, RN 06/07/2018, 9:12 AM

## 2018-06-07 NOTE — Progress Notes (Signed)
SATURATION QUALIFICATIONS: (This note is used to comply with regulatory documentation for home oxygen)  Patient Saturations on Room Air at Rest = 91%  Patient Saturations on Room Air while Ambulating = Not tested, pt unable to to perform  Patient Saturations on 2 Liters of oxygen while Ambulating = 90%  Please briefly explain why patient needs home oxygen: Pt is on home O2 with [facility].

## 2018-06-07 NOTE — Care Management Note (Addendum)
Case Management Note  Patient Details  Name: Alexandria Coffey MRN: [id number] Date of Birth: 11/06/1951  Subjective/Objective:  From home with children, she has home oxygen with [facility]  2 liters , she presents with syncope/fall, she is for dc today, will need HHPT, HHOT, HHAIDE, she chose [facility] from the [facility], referral made to [place] with [facility] for HHPT, OT, AIDE, waiting to hear back from Lupita Leash to make sure she got referral.  Also referral made to [facility] for rolling walker and for him to bring a tank up to patient's room to go home with.   Per Lupita Leash with [facility] state they can provide the HHPT, and HHAIDE, but not the HHOT.  NCM informed patient and she states she would still like to cont with [facility] w/out the HHOT.  This information given to Lupita Leash.   NCM spoke with patient and informed her that she is medically ready for dc, patient states she is going home today.             Action/Plan: DC home with [facility] services when oxygen tank and walker delivered to patient's room.  Expected Discharge Date:  06/07/18               Expected Discharge Plan:  Home w Home Health Services  In-House Referral:     Discharge planning Services  CM Consult  Post Acute Care Choice:  Durable Medical Equipment, Home Health Choice offered to:  Patient  DME Arranged:  Walker rolling DME Agency:  [facility]  HH Arranged:  PT, OT, Nurse's Aide HH Agency:  [facility]  Status of Service:  Completed, signed off  If discussed at Long Length of Stay Meetings, dates discussed:    Additional Comments:  Leone Haven, RN 06/07/2018, 9:19 AM

## 2018-06-26 ENCOUNTER — Encounter: Payer: Self-pay | Admitting: Pulmonary Disease

## 2018-06-26 ENCOUNTER — Ambulatory Visit (INDEPENDENT_AMBULATORY_CARE_PROVIDER_SITE_OTHER): Payer: Medicare Other | Admitting: Pulmonary Disease

## 2018-06-26 ENCOUNTER — Other Ambulatory Visit: Payer: Medicare Other

## 2018-06-26 VITALS — BP 116/82 | HR 91 | Temp 97.4°F | Ht 62.0 in | Wt 115.2 lb

## 2018-06-26 DIAGNOSIS — J8410 Pulmonary fibrosis, unspecified: Secondary | ICD-10-CM

## 2018-06-26 MED ORDER — HYDROXYZINE HCL 10 MG PO TABS: 10.0000 mg | ORAL_TABLET | Freq: Four times a day (QID) | ORAL | 0 refills | Status: DC | PRN

## 2018-06-26 MED ORDER — FLUTTER DEVI: 0 refills | Status: AC

## 2018-06-26 MED ORDER — FLUTICASONE-UMECLIDIN-VILANT 100-62.5-25 MCG/INH IN AEPB: 1.0000 | INHALATION_SPRAY | Freq: Every day | RESPIRATORY_TRACT | 0 refills | Status: AC

## 2018-06-26 MED ORDER — FLUTICASONE-UMECLIDIN-VILANT 100-62.5-25 MCG/INH IN AEPB: 1.0000 | INHALATION_SPRAY | Freq: Every day | RESPIRATORY_TRACT | 5 refills | Status: DC
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
## 2018-06-26 NOTE — Patient Instructions (Addendum)
Continue to work on stopping cocaine Will check your oxygen levels on exertion to see if you can be qualified for portable concentrator We will give flutter valve for clearance of secretions.  Check sputum for AFB, fungal, regular cultures. Check some labs today including alpha-1 antitrypsin levels and phenotype, cystic fibrosis panel, quantitative immunoglobulin, ANA, RF, CCP We will get a follow-up CT in September 2019  Stop the [facility].  We will start you on trelegy inhaler We will give you a refill for Vistaril for itching Follow-up after CT in September.

## 2018-06-30 LAB — ANA,IFA RA DIAG PNL W/RFLX TIT/PATN
ANA: NEGATIVE
Cyclic Citrullin Peptide Ab: >250 UNITS — ABNORMAL HIGH
Rhuematoid fact SerPl-aCnc: 159 IU/mL — ABNORMAL HIGH (ref <14)

## 2018-06-30 LAB — IGG, IGA, IGM
IMMUNOGLOBULIN A: 1044 mg/dL — AB (ref 20–320)
IgG (Immunoglobin G), Serum: 2528 mg/dL — ABNORMAL HIGH (ref 600–1540)
IgM, Serum: 125 mg/dL (ref 50–300)

## 2018-06-30 LAB — ALPHA-1 ANTITRYPSIN PHENOTYPE: A-1 Antitrypsin, Ser: 194 mg/dL (ref 83–199)

## 2018-07-04 ENCOUNTER — Other Ambulatory Visit: Payer: Self-pay | Admitting: Pulmonary Disease

## 2018-07-04 ENCOUNTER — Telehealth: Payer: Self-pay | Admitting: Pulmonary Disease

## 2018-07-04 DIAGNOSIS — M069 Rheumatoid arthritis, unspecified: Secondary | ICD-10-CM

## 2018-07-04 DIAGNOSIS — M0520 Rheumatoid vasculitis with rheumatoid arthritis of unspecified site: Secondary | ICD-10-CM

## 2018-07-04 LAB — CYSTIC FIBROSIS MUTATION 97: Interpretation: NOT DETECTED

## 2018-07-04 NOTE — Telephone Encounter (Signed)
Called and spoke with patient advised of results. Patient verbalized understanding. Nothing further needed.  

## 2018-07-13 ENCOUNTER — Ambulatory Visit: Payer: Medicare Other | Admitting: Pulmonary Disease

## 2018-08-09 ENCOUNTER — Ambulatory Visit (INDEPENDENT_AMBULATORY_CARE_PROVIDER_SITE_OTHER): Payer: Medicare Other | Admitting: Pulmonary Disease

## 2018-08-09 ENCOUNTER — Encounter: Payer: Self-pay | Admitting: Pulmonary Disease

## 2018-08-09 VITALS — BP 120/76 | HR 110 | Ht 61.0 in | Wt 110.0 lb

## 2018-08-09 DIAGNOSIS — J8410 Pulmonary fibrosis, unspecified: Secondary | ICD-10-CM

## 2018-08-09 NOTE — Patient Instructions (Signed)
Will order a follow-up CT of the chest with contrast in July 2020 Follow-up in clinic after CT scan Continue to work on smoking cessation. We will evaluate you for portable concentrator.
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
## 2018-08-09 NOTE — Addendum Note (Signed)
Addended by: Erlinda Hong on: 08/09/2018 01:58 PM   Modules accepted: Orders

## 2018-09-14 ENCOUNTER — Other Ambulatory Visit: Payer: Self-pay | Admitting: Pulmonary Disease

## 2018-10-05 ENCOUNTER — Encounter (HOSPITAL_BASED_OUTPATIENT_CLINIC_OR_DEPARTMENT_OTHER): Payer: Self-pay | Admitting: *Deleted

## 2018-10-05 ENCOUNTER — Emergency Department (HOSPITAL_BASED_OUTPATIENT_CLINIC_OR_DEPARTMENT_OTHER)
Admission: EM | Admit: 2018-10-05 | Discharge: 2018-10-05 | Disposition: A | Discharge status: Home / Self Care | Payer: Medicare Other | Attending: Emergency Medicine | Admitting: Emergency Medicine

## 2018-10-05 ENCOUNTER — Emergency Department (HOSPITAL_BASED_OUTPATIENT_CLINIC_OR_DEPARTMENT_OTHER): Payer: Medicare Other

## 2018-10-05 ENCOUNTER — Other Ambulatory Visit: Payer: Self-pay

## 2018-10-05 DIAGNOSIS — F17210 Nicotine dependence, cigarettes, uncomplicated: Secondary | ICD-10-CM | POA: Diagnosis not present

## 2018-10-05 DIAGNOSIS — Z7984 Long term (current) use of oral hypoglycemic drugs: Secondary | ICD-10-CM | POA: Insufficient documentation

## 2018-10-05 DIAGNOSIS — M5412 Radiculopathy, cervical region: Secondary | ICD-10-CM | POA: Diagnosis not present

## 2018-10-05 DIAGNOSIS — F419 Anxiety disorder, unspecified: Secondary | ICD-10-CM | POA: Insufficient documentation

## 2018-10-05 DIAGNOSIS — I5032 Chronic diastolic (congestive) heart failure: Secondary | ICD-10-CM | POA: Diagnosis not present

## 2018-10-05 DIAGNOSIS — F1410 Cocaine abuse, uncomplicated: Secondary | ICD-10-CM | POA: Diagnosis not present

## 2018-10-05 DIAGNOSIS — Z7982 Long term (current) use of aspirin: Secondary | ICD-10-CM | POA: Insufficient documentation

## 2018-10-05 DIAGNOSIS — E119 Type 2 diabetes mellitus without complications: Secondary | ICD-10-CM | POA: Insufficient documentation

## 2018-10-05 DIAGNOSIS — Z79899 Other long term (current) drug therapy: Secondary | ICD-10-CM | POA: Insufficient documentation

## 2018-10-05 DIAGNOSIS — F329 Major depressive disorder, single episode, unspecified: Secondary | ICD-10-CM | POA: Diagnosis not present

## 2018-10-05 DIAGNOSIS — M542 Cervicalgia: Secondary | ICD-10-CM | POA: Diagnosis present

## 2018-10-05 DIAGNOSIS — I110 Hypertensive heart disease with heart failure: Secondary | ICD-10-CM | POA: Insufficient documentation

## 2018-10-05 MED ORDER — TRAMADOL HCL 50 MG PO TABS
50.0000 mg | ORAL_TABLET | Freq: Once | ORAL | Status: AC
  Administered 2018-10-05: 50 mg via ORAL
  Filled 2018-10-05: qty 1

## 2018-10-05 MED ORDER — METHOCARBAMOL 500 MG PO TABS: 500.0000 mg | ORAL_TABLET | Freq: Two times a day (BID) | ORAL | 0 refills | Status: DC

## 2018-10-05 MED ORDER — PREDNISONE 50 MG PO TABS
60.0000 mg | ORAL_TABLET | Freq: Once | ORAL | Status: AC
  Administered 2018-10-05: 60 mg via ORAL
  Filled 2018-10-05: qty 1

## 2018-10-05 MED ORDER — PREDNISONE 50 MG PO TABS: 50.0000 mg | ORAL_TABLET | Freq: Every day | ORAL | 0 refills | Status: AC

## 2018-10-05 MED FILL — predniSONE 50 MG TABS: 50 | 5 days supply | Qty: 5 | Fill #0

## 2018-10-05 MED FILL — METHOCARBAMOL 500 MG TABLET: 500 | 10 days supply | Qty: 20 | Fill #0

## 2018-10-05 NOTE — ED Triage Notes (Signed)
C/left sided neck and shoulder pain increased with movt x 3 days

## 2018-10-05 NOTE — Discharge Instructions (Addendum)
Your symptoms are likely due to a cervical radiculopathy which is inflammation of the nerve coming from her neck that goes down into your arm, you also have a lot of muscle spasm noted on exam which can worsen the symptoms.  Please use prednisone once daily for the next 5 days, take this in the morning with food.  You can use Robaxin to help ease pain and reduce muscle spasm, this can cause drowsiness, do not take before driving.  You can take Tylenol in addition to these and use over-the-counter salon pas to cane patches.  Please follow-up with your primary care doctor in 1 week if symptoms are not improving.  Return to the emergency department if you have numbness or weakness in your arm, pain in your chest, shortness of breath or any other new or concerning symptoms.

## 2018-10-05 NOTE — ED Notes (Signed)
Pt in xray
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
## 2018-12-02 ENCOUNTER — Other Ambulatory Visit: Payer: Self-pay

## 2018-12-02 ENCOUNTER — Encounter (HOSPITAL_BASED_OUTPATIENT_CLINIC_OR_DEPARTMENT_OTHER): Payer: Self-pay | Admitting: *Deleted

## 2018-12-02 ENCOUNTER — Emergency Department (HOSPITAL_BASED_OUTPATIENT_CLINIC_OR_DEPARTMENT_OTHER): Payer: Medicare Other

## 2018-12-02 ENCOUNTER — Emergency Department (HOSPITAL_BASED_OUTPATIENT_CLINIC_OR_DEPARTMENT_OTHER)
Admission: EM | Admit: 2018-12-02 | Discharge: 2018-12-02 | Disposition: A | Discharge status: Home / Self Care | Payer: Medicare Other | Attending: Emergency Medicine | Admitting: Emergency Medicine

## 2018-12-02 DIAGNOSIS — M79642 Pain in left hand: Secondary | ICD-10-CM | POA: Insufficient documentation

## 2018-12-02 DIAGNOSIS — F17210 Nicotine dependence, cigarettes, uncomplicated: Secondary | ICD-10-CM | POA: Diagnosis not present

## 2018-12-02 DIAGNOSIS — I5032 Chronic diastolic (congestive) heart failure: Secondary | ICD-10-CM | POA: Diagnosis not present

## 2018-12-02 DIAGNOSIS — I110 Hypertensive heart disease with heart failure: Secondary | ICD-10-CM | POA: Diagnosis not present

## 2018-12-02 DIAGNOSIS — Z7984 Long term (current) use of oral hypoglycemic drugs: Secondary | ICD-10-CM | POA: Insufficient documentation

## 2018-12-02 DIAGNOSIS — E1151 Type 2 diabetes mellitus with diabetic peripheral angiopathy without gangrene: Secondary | ICD-10-CM | POA: Diagnosis not present

## 2018-12-02 HISTORY — DX: Rheumatoid arthritis, unspecified: M06.9

## 2018-12-02 LAB — CBC WITH DIFFERENTIAL/PLATELET
Abs Immature Granulocytes: 0.01 10*3/uL (ref 0.00–0.07)
BASOS ABS: 0 10*3/uL (ref 0.0–0.1)
Basophils Relative: 0 %
EOS ABS: 0 10*3/uL (ref 0.0–0.5)
Eosinophils Relative: 0 %
HEMATOCRIT: 39.1 % (ref 36.0–46.0)
Hemoglobin: 12 g/dL (ref 12.0–15.0)
IMMATURE GRANULOCYTES: 0 %
LYMPHS ABS: 1.6 10*3/uL (ref 0.7–4.0)
LYMPHS PCT: 23 %
MCH: 24 pg — ABNORMAL LOW (ref 26.0–34.0)
MCHC: 30.7 g/dL (ref 30.0–36.0)
MCV: 78.4 fL — ABNORMAL LOW (ref 80.0–100.0)
Monocytes Absolute: 0.9 10*3/uL (ref 0.1–1.0)
Monocytes Relative: 13 %
NEUTROS PCT: 64 %
NRBC: 0 % (ref 0.0–0.2)
Neutro Abs: 4.4 10*3/uL (ref 1.7–7.7)
Platelets: 288 10*3/uL (ref 150–400)
RBC: 4.99 MIL/uL (ref 3.87–5.11)
RDW: 15.6 % — AB (ref 11.5–15.5)
WBC: 7 10*3/uL (ref 4.0–10.5)

## 2018-12-02 LAB — BASIC METABOLIC PANEL
ANION GAP: 7 (ref 5–15)
BUN: 9 mg/dL (ref 8–23)
CALCIUM: 8.8 mg/dL — AB (ref 8.9–10.3)
CO2: 28 mmol/L (ref 22–32)
Chloride: 99 mmol/L (ref 98–111)
Creatinine, Ser: 0.85 mg/dL (ref 0.44–1.00)
GFR calc non Af Amer: >60 mL/min (ref >60)
Glucose, Bld: 169 mg/dL — ABNORMAL HIGH (ref 70–99)
Potassium: 3.9 mmol/L (ref 3.5–5.1)
Sodium: 134 mmol/L — ABNORMAL LOW (ref 135–145)

## 2018-12-02 LAB — CBG MONITORING, ED: Glucose-Capillary: 175 mg/dL — ABNORMAL HIGH (ref 70–99)

## 2018-12-02 MED ORDER — OXYCODONE-ACETAMINOPHEN 5-325 MG PO TABS: 1.0000 | ORAL_TABLET | ORAL | 0 refills | Status: DC | PRN

## 2018-12-02 MED ORDER — ACETAMINOPHEN 500 MG PO TABS
500.0000 mg | ORAL_TABLET | Freq: Once | ORAL | Status: AC
  Administered 2018-12-02: 500 mg via ORAL
  Filled 2018-12-02: qty 1

## 2018-12-02 MED ORDER — OXYCODONE-ACETAMINOPHEN 5-325 MG PO TABS
1.0000 | ORAL_TABLET | Freq: Once | ORAL | Status: AC
  Administered 2018-12-02: 1 via ORAL
  Filled 2018-12-02: qty 1

## 2018-12-02 MED ORDER — CEPHALEXIN 250 MG PO CAPS
500.0000 mg | ORAL_CAPSULE | Freq: Once | ORAL | Status: AC
  Administered 2018-12-02: 500 mg via ORAL
  Filled 2018-12-02: qty 2

## 2018-12-02 MED ORDER — CEPHALEXIN 500 MG PO CAPS: 500.0000 mg | ORAL_CAPSULE | Freq: Two times a day (BID) | ORAL | 0 refills | Status: AC

## 2018-12-02 NOTE — ED Notes (Signed)
Left hand is swollen and red.  Patient stated that it was so swollen yesterday that both her left index and left middle finger look like they are together.

## 2018-12-02 NOTE — ED Notes (Signed)
ED Provider at bedside. 

## 2018-12-02 NOTE — ED Triage Notes (Addendum)
Pt reports left hand swelling since yesterday. Denies injury. Back of hand red, swollen, warm to touch, and painful. Pt is a diabetic, takes glipizide. States she has lost >15 lbs in the last 3 months and has not checked her sugar recently
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
## 2018-12-02 NOTE — Discharge Instructions (Addendum)
You were given a prescription for antibiotics. Please take the antibiotic prescription fully.   Please return to either your regular doctor urgent care or the emergency department for a wound recheck in 2 days.  Return sooner for any signs of infection including fevers, increased redness, increased swelling increased pain or any new or worsening symptoms.

## 2018-12-06 ENCOUNTER — Emergency Department (HOSPITAL_BASED_OUTPATIENT_CLINIC_OR_DEPARTMENT_OTHER)
Admission: EM | Admit: 2018-12-06 | Discharge: 2018-12-06 | Disposition: A | Discharge status: Home / Self Care | Payer: Medicare Other | Attending: Emergency Medicine | Admitting: Emergency Medicine

## 2018-12-06 ENCOUNTER — Other Ambulatory Visit: Payer: Self-pay

## 2018-12-06 ENCOUNTER — Encounter (HOSPITAL_BASED_OUTPATIENT_CLINIC_OR_DEPARTMENT_OTHER): Payer: Self-pay | Admitting: Adult Health

## 2018-12-06 DIAGNOSIS — F17210 Nicotine dependence, cigarettes, uncomplicated: Secondary | ICD-10-CM | POA: Diagnosis not present

## 2018-12-06 DIAGNOSIS — H5711 Ocular pain, right eye: Secondary | ICD-10-CM | POA: Diagnosis present

## 2018-12-06 DIAGNOSIS — H1131 Conjunctival hemorrhage, right eye: Secondary | ICD-10-CM | POA: Insufficient documentation

## 2018-12-06 DIAGNOSIS — E1151 Type 2 diabetes mellitus with diabetic peripheral angiopathy without gangrene: Secondary | ICD-10-CM | POA: Diagnosis not present

## 2018-12-06 DIAGNOSIS — Z7982 Long term (current) use of aspirin: Secondary | ICD-10-CM | POA: Insufficient documentation

## 2018-12-06 DIAGNOSIS — I5032 Chronic diastolic (congestive) heart failure: Secondary | ICD-10-CM | POA: Insufficient documentation

## 2018-12-06 DIAGNOSIS — I110 Hypertensive heart disease with heart failure: Secondary | ICD-10-CM | POA: Insufficient documentation

## 2018-12-06 DIAGNOSIS — Z7984 Long term (current) use of oral hypoglycemic drugs: Secondary | ICD-10-CM | POA: Diagnosis not present

## 2018-12-06 DIAGNOSIS — L03114 Cellulitis of left upper limb: Secondary | ICD-10-CM | POA: Insufficient documentation

## 2018-12-06 DIAGNOSIS — L03119 Cellulitis of unspecified part of limb: Secondary | ICD-10-CM

## 2018-12-06 DIAGNOSIS — Z79899 Other long term (current) drug therapy: Secondary | ICD-10-CM | POA: Diagnosis not present

## 2018-12-06 DIAGNOSIS — M069 Rheumatoid arthritis, unspecified: Secondary | ICD-10-CM | POA: Insufficient documentation

## 2018-12-06 LAB — CBC WITH DIFFERENTIAL/PLATELET
Abs Immature Granulocytes: 0.01 10*3/uL (ref 0.00–0.07)
BASOS ABS: 0 10*3/uL (ref 0.0–0.1)
BASOS PCT: 1 %
EOS ABS: 0 10*3/uL (ref 0.0–0.5)
EOS PCT: 1 %
HCT: 38.4 % (ref 36.0–46.0)
HEMOGLOBIN: 11.7 g/dL — AB (ref 12.0–15.0)
Immature Granulocytes: 0 %
Lymphocytes Relative: 34 %
Lymphs Abs: 1.4 10*3/uL (ref 0.7–4.0)
MCH: 24 pg — AB (ref 26.0–34.0)
MCHC: 30.5 g/dL (ref 30.0–36.0)
MCV: 78.7 fL — ABNORMAL LOW (ref 80.0–100.0)
MONO ABS: 0.4 10*3/uL (ref 0.1–1.0)
Monocytes Relative: 9 %
NRBC: 0 % (ref 0.0–0.2)
Neutro Abs: 2.4 10*3/uL (ref 1.7–7.7)
Neutrophils Relative %: 55 %
PLATELETS: 263 10*3/uL (ref 150–400)
RBC: 4.88 MIL/uL (ref 3.87–5.11)
RDW: 15.5 % (ref 11.5–15.5)
WBC: 4.2 10*3/uL (ref 4.0–10.5)

## 2018-12-06 LAB — URINALYSIS, ROUTINE W REFLEX MICROSCOPIC
BILIRUBIN URINE: NEGATIVE
Glucose, UA: NEGATIVE mg/dL
HGB URINE DIPSTICK: NEGATIVE
KETONES UR: NEGATIVE mg/dL
Leukocytes, UA: NEGATIVE
NITRITE: NEGATIVE
PROTEIN: NEGATIVE mg/dL
Specific Gravity, Urine: 1.01 (ref 1.005–1.030)
pH: 7.5 (ref 5.0–8.0)

## 2018-12-06 LAB — COMPREHENSIVE METABOLIC PANEL
ALBUMIN: 3 g/dL — AB (ref 3.5–5.0)
ALT: 12 U/L (ref 0–44)
AST: 21 U/L (ref 15–41)
Alkaline Phosphatase: 102 U/L (ref 38–126)
Anion gap: 5 (ref 5–15)
BUN: 6 mg/dL — ABNORMAL LOW (ref 8–23)
CALCIUM: 8.7 mg/dL — AB (ref 8.9–10.3)
CO2: 32 mmol/L (ref 22–32)
Chloride: 99 mmol/L (ref 98–111)
Creatinine, Ser: 0.61 mg/dL (ref 0.44–1.00)
GFR calc non Af Amer: >60 mL/min (ref >60)
GLUCOSE: 188 mg/dL — AB (ref 70–99)
POTASSIUM: 3.9 mmol/L (ref 3.5–5.1)
SODIUM: 136 mmol/L (ref 135–145)
Total Bilirubin: 0.4 mg/dL (ref 0.3–1.2)
Total Protein: 7.3 g/dL (ref 6.5–8.1)

## 2018-12-06 LAB — LIPASE, BLOOD: Lipase: 25 U/L (ref 11–51)

## 2018-12-06 MED ORDER — DIPHENHYDRAMINE HCL 50 MG/ML IJ SOLN
25.0000 mg | Freq: Once | INTRAMUSCULAR | Status: AC
  Administered 2018-12-06: 25 mg via INTRAVENOUS
  Filled 2018-12-06: qty 1

## 2018-12-06 MED ORDER — SODIUM CHLORIDE 0.9 % IV BOLUS
1000.0000 mL | Freq: Once | INTRAVENOUS | Status: AC
  Administered 2018-12-06: 1000 mL via INTRAVENOUS

## 2018-12-06 MED ORDER — ONDANSETRON HCL 4 MG/2ML IJ SOLN
4.0000 mg | Freq: Once | INTRAMUSCULAR | Status: AC
  Administered 2018-12-06: 4 mg via INTRAVENOUS
  Filled 2018-12-06: qty 2

## 2018-12-06 MED ORDER — HYDROCODONE-ACETAMINOPHEN 5-325 MG PO TABS: 1.0000 | ORAL_TABLET | ORAL | 0 refills | Status: DC | PRN

## 2018-12-06 MED ORDER — MORPHINE SULFATE (PF) 4 MG/ML IV SOLN
4.0000 mg | Freq: Once | INTRAVENOUS | Status: AC
  Administered 2018-12-06: 4 mg via INTRAVENOUS
  Filled 2018-12-06: qty 1

## 2018-12-06 MED FILL — HYDROCODON-APAP 5-325: 5-325 | 2 days supply | Qty: 10 | Fill #0
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
## 2018-12-06 NOTE — ED Triage Notes (Signed)
Presents with subconjunctival hemorrhage to right eye, no blood visualized in iris, she reports that she woke with Monday morning. SHe also has pain, redness and swelling to left hand

## 2018-12-06 NOTE — ED Notes (Signed)
NAD at this time. Pt is stable and going home.  

## 2019-01-08 ENCOUNTER — Other Ambulatory Visit: Payer: Self-pay | Admitting: Internal Medicine

## 2019-01-08 DIAGNOSIS — Z1231 Encounter for screening mammogram for malignant neoplasm of breast: Secondary | ICD-10-CM

## 2019-01-08 DIAGNOSIS — E2839 Other primary ovarian failure: Secondary | ICD-10-CM

## 2019-02-04 ENCOUNTER — Ambulatory Visit (INDEPENDENT_AMBULATORY_CARE_PROVIDER_SITE_OTHER): Payer: Medicare Other | Admitting: Cardiology

## 2019-02-04 ENCOUNTER — Encounter: Payer: Self-pay | Admitting: Cardiology

## 2019-02-04 VITALS — BP 92/63 | HR 99 | Ht 62.0 in | Wt 113.0 lb

## 2019-02-04 DIAGNOSIS — Z720 Tobacco use: Secondary | ICD-10-CM

## 2019-02-04 DIAGNOSIS — R9431 Abnormal electrocardiogram [ECG] [EKG]: Secondary | ICD-10-CM | POA: Insufficient documentation

## 2019-02-04 DIAGNOSIS — Z87898 Personal history of other specified conditions: Secondary | ICD-10-CM

## 2019-02-04 DIAGNOSIS — I739 Peripheral vascular disease, unspecified: Secondary | ICD-10-CM

## 2019-02-04 DIAGNOSIS — I6521 Occlusion and stenosis of right carotid artery: Secondary | ICD-10-CM

## 2019-02-04 DIAGNOSIS — Q240 Dextrocardia: Secondary | ICD-10-CM | POA: Diagnosis not present

## 2019-02-04 DIAGNOSIS — F1991 Other psychoactive substance use, unspecified, in remission: Secondary | ICD-10-CM | POA: Insufficient documentation

## 2019-02-04 MED ORDER — ROSUVASTATIN CALCIUM 10 MG PO TABS: 10.0000 mg | ORAL_TABLET | Freq: Every day | ORAL | 2 refills | Status: AC
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
## 2019-03-18 ENCOUNTER — Other Ambulatory Visit: Payer: Medicare Other

## 2019-03-18 ENCOUNTER — Other Ambulatory Visit: Payer: Self-pay | Admitting: Cardiology

## 2019-03-18 ENCOUNTER — Ambulatory Visit: Payer: Medicare Other

## 2019-03-18 DIAGNOSIS — R0989 Other specified symptoms and signs involving the circulatory and respiratory systems: Secondary | ICD-10-CM

## 2019-04-30 ENCOUNTER — Emergency Department (HOSPITAL_BASED_OUTPATIENT_CLINIC_OR_DEPARTMENT_OTHER)
Admission: EM | Admit: 2019-04-30 | Discharge: 2019-04-30 | Disposition: A | Discharge status: Home / Self Care | Payer: Medicare Other | Attending: Emergency Medicine | Admitting: Emergency Medicine

## 2019-04-30 ENCOUNTER — Other Ambulatory Visit: Payer: Self-pay

## 2019-04-30 ENCOUNTER — Encounter (HOSPITAL_BASED_OUTPATIENT_CLINIC_OR_DEPARTMENT_OTHER): Payer: Self-pay | Admitting: *Deleted

## 2019-04-30 ENCOUNTER — Emergency Department (HOSPITAL_BASED_OUTPATIENT_CLINIC_OR_DEPARTMENT_OTHER): Payer: Medicare Other

## 2019-04-30 DIAGNOSIS — Z7982 Long term (current) use of aspirin: Secondary | ICD-10-CM | POA: Diagnosis not present

## 2019-04-30 DIAGNOSIS — I5032 Chronic diastolic (congestive) heart failure: Secondary | ICD-10-CM | POA: Insufficient documentation

## 2019-04-30 DIAGNOSIS — M79604 Pain in right leg: Secondary | ICD-10-CM | POA: Diagnosis not present

## 2019-04-30 DIAGNOSIS — I110 Hypertensive heart disease with heart failure: Secondary | ICD-10-CM | POA: Diagnosis not present

## 2019-04-30 DIAGNOSIS — F17210 Nicotine dependence, cigarettes, uncomplicated: Secondary | ICD-10-CM | POA: Diagnosis not present

## 2019-04-30 DIAGNOSIS — Z79899 Other long term (current) drug therapy: Secondary | ICD-10-CM | POA: Insufficient documentation

## 2019-04-30 DIAGNOSIS — E119 Type 2 diabetes mellitus without complications: Secondary | ICD-10-CM | POA: Diagnosis not present

## 2019-04-30 LAB — CBC WITH DIFFERENTIAL/PLATELET
Abs Immature Granulocytes: 0.03 10*3/uL (ref 0.00–0.07)
Basophils Absolute: 0 10*3/uL (ref 0.0–0.1)
Basophils Relative: 1 %
Eosinophils Absolute: 0.1 10*3/uL (ref 0.0–0.5)
Eosinophils Relative: 1 %
HCT: 37.8 % (ref 36.0–46.0)
Hemoglobin: 11.3 g/dL — ABNORMAL LOW (ref 12.0–15.0)
Immature Granulocytes: 1 %
Lymphocytes Relative: 29 %
Lymphs Abs: 1.7 10*3/uL (ref 0.7–4.0)
MCH: 21.4 pg — ABNORMAL LOW (ref 26.0–34.0)
MCHC: 29.9 g/dL — ABNORMAL LOW (ref 30.0–36.0)
MCV: 71.7 fL — ABNORMAL LOW (ref 80.0–100.0)
Monocytes Absolute: 0.6 10*3/uL (ref 0.1–1.0)
Monocytes Relative: 10 %
Neutro Abs: 3.6 10*3/uL (ref 1.7–7.7)
Neutrophils Relative %: 58 %
Platelets: 350 10*3/uL (ref 150–400)
RBC: 5.27 MIL/uL — ABNORMAL HIGH (ref 3.87–5.11)
RDW: 23 % — ABNORMAL HIGH (ref 11.5–15.5)
WBC: 6 10*3/uL (ref 4.0–10.5)
nRBC: 0 % (ref 0.0–0.2)

## 2019-04-30 LAB — PROTIME-INR
INR: 1.1 (ref 0.8–1.2)
Prothrombin Time: 13.6 seconds (ref 11.4–15.2)

## 2019-04-30 LAB — BASIC METABOLIC PANEL
Anion gap: 8 (ref 5–15)
BUN: 9 mg/dL (ref 8–23)
CO2: 35 mmol/L — ABNORMAL HIGH (ref 22–32)
Calcium: 8 mg/dL — ABNORMAL LOW (ref 8.9–10.3)
Chloride: 97 mmol/L — ABNORMAL LOW (ref 98–111)
Creatinine, Ser: 0.89 mg/dL (ref 0.44–1.00)
GFR calc Af Amer: >60 mL/min (ref >60)
GFR calc non Af Amer: >60 mL/min (ref >60)
Glucose, Bld: 116 mg/dL — ABNORMAL HIGH (ref 70–99)
Potassium: 3.9 mmol/L (ref 3.5–5.1)
Sodium: 140 mmol/L (ref 135–145)

## 2019-04-30 MED ORDER — HYDROCODONE-ACETAMINOPHEN 5-325 MG PO TABS
1.0000 | ORAL_TABLET | Freq: Once | ORAL | Status: AC
  Administered 2019-04-30: 1 via ORAL
  Filled 2019-04-30: qty 1

## 2019-04-30 MED ORDER — HYDROCODONE-ACETAMINOPHEN 5-325 MG PO TABS: 1.0000 | ORAL_TABLET | Freq: Four times a day (QID) | ORAL | 0 refills | Status: DC | PRN

## 2019-04-30 MED ORDER — CEPHALEXIN 250 MG PO CAPS: 250.0000 mg | ORAL_CAPSULE | Freq: Four times a day (QID) | ORAL | 0 refills | Status: DC

## 2019-04-30 MED FILL — CEPHALEXIN 250 MG CAPSULE: 250 | 7 days supply | Qty: 28 | Fill #0

## 2019-04-30 MED FILL — HYDROCODON-APAP 5-325: 5-325 | 2 days supply | Qty: 10 | Fill #0
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
## 2019-04-30 NOTE — Discharge Instructions (Addendum)
You were evaluated in the emergency department for swelling and pain in your right lower leg.  You had blood work and ultrasound that did not show an obvious explanation for your symptoms.  This could be a flareup of some arthritis and you should use some ibuprofen.  It also could be an early skin infection and we are prescribing you antibiotics and some pain medicine.  Please schedule follow-up with your doctor and return if any worsening.

## 2019-04-30 NOTE — ED Triage Notes (Addendum)
C/o left leg and foot swelling and painful x 1 week  Denies inj  Swelling comes and goes  Some redness noted

## 2019-05-04 ENCOUNTER — Observation Stay (HOSPITAL_COMMUNITY)
Admission: EM | Admit: 2019-05-04 | Discharge: 2019-05-06 | Disposition: A | Discharge status: Home / Self Care | Payer: Medicare Other | Attending: Internal Medicine | Admitting: Internal Medicine

## 2019-05-04 ENCOUNTER — Other Ambulatory Visit: Payer: Self-pay

## 2019-05-04 ENCOUNTER — Emergency Department (HOSPITAL_COMMUNITY): Payer: Medicare Other

## 2019-05-04 ENCOUNTER — Encounter (HOSPITAL_COMMUNITY): Payer: Self-pay

## 2019-05-04 DIAGNOSIS — K7682 Hepatic encephalopathy: Secondary | ICD-10-CM

## 2019-05-04 DIAGNOSIS — I509 Heart failure, unspecified: Secondary | ICD-10-CM | POA: Diagnosis not present

## 2019-05-04 DIAGNOSIS — Z7984 Long term (current) use of oral hypoglycemic drugs: Secondary | ICD-10-CM | POA: Insufficient documentation

## 2019-05-04 DIAGNOSIS — F329 Major depressive disorder, single episode, unspecified: Secondary | ICD-10-CM | POA: Insufficient documentation

## 2019-05-04 DIAGNOSIS — I110 Hypertensive heart disease with heart failure: Secondary | ICD-10-CM | POA: Insufficient documentation

## 2019-05-04 DIAGNOSIS — E11649 Type 2 diabetes mellitus with hypoglycemia without coma: Secondary | ICD-10-CM | POA: Insufficient documentation

## 2019-05-04 DIAGNOSIS — K7290 Hepatic failure, unspecified without coma: Secondary | ICD-10-CM

## 2019-05-04 DIAGNOSIS — E119 Type 2 diabetes mellitus without complications: Secondary | ICD-10-CM | POA: Diagnosis not present

## 2019-05-04 DIAGNOSIS — M069 Rheumatoid arthritis, unspecified: Secondary | ICD-10-CM | POA: Insufficient documentation

## 2019-05-04 DIAGNOSIS — R4182 Altered mental status, unspecified: Secondary | ICD-10-CM | POA: Diagnosis present

## 2019-05-04 DIAGNOSIS — B182 Chronic viral hepatitis C: Secondary | ICD-10-CM | POA: Insufficient documentation

## 2019-05-04 DIAGNOSIS — E7800 Pure hypercholesterolemia, unspecified: Secondary | ICD-10-CM | POA: Insufficient documentation

## 2019-05-04 DIAGNOSIS — Z7982 Long term (current) use of aspirin: Secondary | ICD-10-CM | POA: Diagnosis not present

## 2019-05-04 DIAGNOSIS — E876 Hypokalemia: Secondary | ICD-10-CM | POA: Diagnosis not present

## 2019-05-04 DIAGNOSIS — Z9981 Dependence on supplemental oxygen: Secondary | ICD-10-CM | POA: Insufficient documentation

## 2019-05-04 DIAGNOSIS — K219 Gastro-esophageal reflux disease without esophagitis: Secondary | ICD-10-CM | POA: Diagnosis not present

## 2019-05-04 DIAGNOSIS — G92 Toxic encephalopathy: Secondary | ICD-10-CM

## 2019-05-04 DIAGNOSIS — F17210 Nicotine dependence, cigarettes, uncomplicated: Secondary | ICD-10-CM | POA: Insufficient documentation

## 2019-05-04 DIAGNOSIS — K7200 Acute and subacute hepatic failure without coma: Secondary | ICD-10-CM | POA: Diagnosis not present

## 2019-05-04 DIAGNOSIS — Z7951 Long term (current) use of inhaled steroids: Secondary | ICD-10-CM | POA: Diagnosis not present

## 2019-05-04 DIAGNOSIS — Z1159 Encounter for screening for other viral diseases: Secondary | ICD-10-CM | POA: Insufficient documentation

## 2019-05-04 DIAGNOSIS — F1410 Cocaine abuse, uncomplicated: Secondary | ICD-10-CM | POA: Insufficient documentation

## 2019-05-04 DIAGNOSIS — Z79899 Other long term (current) drug therapy: Secondary | ICD-10-CM | POA: Diagnosis not present

## 2019-05-04 DIAGNOSIS — B1920 Unspecified viral hepatitis C without hepatic coma: Secondary | ICD-10-CM | POA: Insufficient documentation

## 2019-05-04 DIAGNOSIS — R2681 Unsteadiness on feet: Secondary | ICD-10-CM | POA: Insufficient documentation

## 2019-05-04 DIAGNOSIS — R9431 Abnormal electrocardiogram [ECG] [EKG]: Secondary | ICD-10-CM | POA: Insufficient documentation

## 2019-05-04 DIAGNOSIS — I700 Atherosclerosis of aorta: Secondary | ICD-10-CM | POA: Insufficient documentation

## 2019-05-04 DIAGNOSIS — E785 Hyperlipidemia, unspecified: Secondary | ICD-10-CM | POA: Diagnosis not present

## 2019-05-04 DIAGNOSIS — J810 Acute pulmonary edema: Secondary | ICD-10-CM

## 2019-05-04 DIAGNOSIS — Z794 Long term (current) use of insulin: Secondary | ICD-10-CM | POA: Insufficient documentation

## 2019-05-04 DIAGNOSIS — G9341 Metabolic encephalopathy: Secondary | ICD-10-CM | POA: Diagnosis present

## 2019-05-04 DIAGNOSIS — F1911 Other psychoactive substance abuse, in remission: Secondary | ICD-10-CM | POA: Insufficient documentation

## 2019-05-04 DIAGNOSIS — I10 Essential (primary) hypertension: Secondary | ICD-10-CM | POA: Diagnosis present

## 2019-05-04 DIAGNOSIS — F419 Anxiety disorder, unspecified: Secondary | ICD-10-CM | POA: Diagnosis not present

## 2019-05-04 DIAGNOSIS — J8410 Pulmonary fibrosis, unspecified: Secondary | ICD-10-CM | POA: Diagnosis not present

## 2019-05-04 DIAGNOSIS — J9611 Chronic respiratory failure with hypoxia: Secondary | ICD-10-CM | POA: Diagnosis present

## 2019-05-04 DIAGNOSIS — E1151 Type 2 diabetes mellitus with diabetic peripheral angiopathy without gangrene: Secondary | ICD-10-CM | POA: Diagnosis present

## 2019-05-04 DIAGNOSIS — R109 Unspecified abdominal pain: Secondary | ICD-10-CM

## 2019-05-04 DIAGNOSIS — I5032 Chronic diastolic (congestive) heart failure: Secondary | ICD-10-CM | POA: Diagnosis present

## 2019-05-04 DIAGNOSIS — I739 Peripheral vascular disease, unspecified: Secondary | ICD-10-CM | POA: Diagnosis present

## 2019-05-04 DIAGNOSIS — Z720 Tobacco use: Secondary | ICD-10-CM | POA: Diagnosis present

## 2019-05-04 DIAGNOSIS — E7220 Disorder of urea cycle metabolism, unspecified: Secondary | ICD-10-CM | POA: Insufficient documentation

## 2019-05-04 DIAGNOSIS — I6523 Occlusion and stenosis of bilateral carotid arteries: Secondary | ICD-10-CM | POA: Insufficient documentation

## 2019-05-04 LAB — DIFFERENTIAL
Abs Immature Granulocytes: 0.03 10*3/uL (ref 0.00–0.07)
Basophils Absolute: 0 10*3/uL (ref 0.0–0.1)
Basophils Relative: 0 %
Eosinophils Absolute: 0 10*3/uL (ref 0.0–0.5)
Eosinophils Relative: 0 %
Immature Granulocytes: 1 %
Lymphocytes Relative: 36 %
Lymphs Abs: 2.3 10*3/uL (ref 0.7–4.0)
Monocytes Absolute: 0.7 10*3/uL (ref 0.1–1.0)
Monocytes Relative: 12 %
Neutro Abs: 3.2 10*3/uL (ref 1.7–7.7)
Neutrophils Relative %: 51 %

## 2019-05-04 LAB — COMPREHENSIVE METABOLIC PANEL
ALT: 12 U/L (ref 0–44)
AST: 18 U/L (ref 15–41)
Albumin: 2.3 g/dL — ABNORMAL LOW (ref 3.5–5.0)
Alkaline Phosphatase: 145 U/L — ABNORMAL HIGH (ref 38–126)
Anion gap: 7 (ref 5–15)
BUN: 8 mg/dL (ref 8–23)
CO2: 32 mmol/L (ref 22–32)
Calcium: 7.5 mg/dL — ABNORMAL LOW (ref 8.9–10.3)
Chloride: 91 mmol/L — ABNORMAL LOW (ref 98–111)
Creatinine, Ser: 0.99 mg/dL (ref 0.44–1.00)
GFR calc Af Amer: >60 mL/min (ref >60)
GFR calc non Af Amer: 59 mL/min — ABNORMAL LOW (ref >60)
Glucose, Bld: 286 mg/dL — ABNORMAL HIGH (ref 70–99)
Potassium: 3.2 mmol/L — ABNORMAL LOW (ref 3.5–5.1)
Sodium: 130 mmol/L — ABNORMAL LOW (ref 135–145)
Total Bilirubin: 0.6 mg/dL (ref 0.3–1.2)
Total Protein: 5.6 g/dL — ABNORMAL LOW (ref 6.5–8.1)

## 2019-05-04 LAB — APTT: aPTT: 26 seconds (ref 24–36)

## 2019-05-04 LAB — POCT I-STAT 7, (LYTES, BLD GAS, ICA,H+H)
Acid-Base Excess: 5 mmol/L — ABNORMAL HIGH (ref 0.0–2.0)
Bicarbonate: 32.2 mmol/L — ABNORMAL HIGH (ref 20.0–28.0)
Calcium, Ion: 1.11 mmol/L — ABNORMAL LOW (ref 1.15–1.40)
HCT: 34 % — ABNORMAL LOW (ref 36.0–46.0)
Hemoglobin: 11.6 g/dL — ABNORMAL LOW (ref 12.0–15.0)
O2 Saturation: 91 %
Patient temperature: 98.7
Potassium: 2.8 mmol/L — ABNORMAL LOW (ref 3.5–5.1)
Sodium: 133 mmol/L — ABNORMAL LOW (ref 135–145)
TCO2: 34 mmol/L — ABNORMAL HIGH (ref 22–32)
pCO2 arterial: 59.2 mmHg — ABNORMAL HIGH (ref 32.0–48.0)
pH, Arterial: 7.344 — ABNORMAL LOW (ref 7.350–7.450)
pO2, Arterial: 66 mmHg — ABNORMAL LOW (ref 83.0–108.0)

## 2019-05-04 LAB — I-STAT CHEM 8, ED
BUN: 9 mg/dL (ref 8–23)
Calcium, Ion: 0.95 mmol/L — ABNORMAL LOW (ref 1.15–1.40)
Chloride: 89 mmol/L — ABNORMAL LOW (ref 98–111)
Creatinine, Ser: 1.1 mg/dL — ABNORMAL HIGH (ref 0.44–1.00)
Glucose, Bld: 274 mg/dL — ABNORMAL HIGH (ref 70–99)
HCT: 36 % (ref 36.0–46.0)
Hemoglobin: 12.2 g/dL (ref 12.0–15.0)
Potassium: 3.3 mmol/L — ABNORMAL LOW (ref 3.5–5.1)
Sodium: 133 mmol/L — ABNORMAL LOW (ref 135–145)
TCO2: 34 mmol/L — ABNORMAL HIGH (ref 22–32)

## 2019-05-04 LAB — CBC
HCT: 31.9 % — ABNORMAL LOW (ref 36.0–46.0)
Hemoglobin: 9.9 g/dL — ABNORMAL LOW (ref 12.0–15.0)
MCH: 21.8 pg — ABNORMAL LOW (ref 26.0–34.0)
MCHC: 31 g/dL (ref 30.0–36.0)
MCV: 70.3 fL — ABNORMAL LOW (ref 80.0–100.0)
Platelets: 396 10*3/uL (ref 150–400)
RBC: 4.54 MIL/uL (ref 3.87–5.11)
RDW: 21.6 % — ABNORMAL HIGH (ref 11.5–15.5)
WBC: 6.3 10*3/uL (ref 4.0–10.5)
nRBC: 0.3 % — ABNORMAL HIGH (ref 0.0–0.2)

## 2019-05-04 LAB — PROTIME-INR
INR: 1.2 (ref 0.8–1.2)
Prothrombin Time: 14.9 seconds (ref 11.4–15.2)

## 2019-05-04 LAB — CBG MONITORING, ED
Glucose-Capillary: 263 mg/dL — ABNORMAL HIGH (ref 70–99)
Glucose-Capillary: 286 mg/dL — ABNORMAL HIGH (ref 70–99)

## 2019-05-04 MED ORDER — IOHEXOL 300 MG/ML  SOLN
80.0000 mL | Freq: Once | INTRAMUSCULAR | Status: AC | PRN
  Administered 2019-05-04: 80 mL via INTRAVENOUS

## 2019-05-04 MED ORDER — SODIUM CHLORIDE 0.9% FLUSH
3.0000 mL | Freq: Once | INTRAVENOUS | Status: AC
  Administered 2019-05-05: 3 mL via INTRAVENOUS
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
## 2019-05-04 NOTE — ED Provider Notes (Signed)
TIME SEEN: 11:11 PM  CHIEF COMPLAINT: code stroke  HPI: Patient is a 68 y.o. F with h/o CHF, hepatitis C, HTN, HLD, DM, PAD, pulmonary fibrosis, rheumatoid arthritis with slurred speech 45 minutes after taking night time meds that include sedatives her daughter.  Last seen normal 8:30 PM.  Blood glucose normal.  Brought in by EMS as a code stroke.  Given 0.4 mg and then 1 mg of Narcan with EMS after decline in status with EMS.  Required sternal rub to arouse here.  No significant change with Narcan.  It appears patient is on aspirin but no other anticoagulation.  It appears patient is also on Elavil.  She received 10 tablets after an ER visit on June 2.  ROS: Level 5 caveat for altered mental status  PAST MEDICAL HISTORY/PAST SURGICAL HISTORY:  Past Medical History:  Diagnosis Date  . Abnormal EKG   . Anxiety   . CAP (community acquired pneumonia) 10/31/2017  . CHF (congestive heart failure) (HCC)   . Chronic bronchitis (HCC)   . Claudication in peripheral vascular disease (HCC) 10/14/2014   chocolate balloon 6.0 x 40 mm angioplasty to left EIA, SFA,, proximal popliteal artery  . Cocaine abuse (HCC)   . Depression   . DOE (dyspnea on exertion)   . GERD (gastroesophageal reflux disease)   . Hepatitis C   . High cholesterol    "only take cholesterol RX because I'm diabetic" (03/29/2016)  . Hypertension   . Lung fibrosis (HCC)   . Osteoporosis   . RA (rheumatoid arthritis) (HCC)   . Tobacco user   . Type II diabetes mellitus (HCC)     MEDICATIONS:  Prior to Admission medications   Medication Sig Start Date End Date Taking? Authorizing Provider  amitriptyline (ELAVIL) 50 MG tablet Take 50 mg by mouth at bedtime.    [provider]  amLODipine-benazepril (LOTREL) 5-20 MG capsule Take 1 capsule by mouth daily. 06/14/18   Richarda Overlie, MD  aspirin EC 81 MG EC tablet Take 1 tablet (81 mg total) by mouth daily. 11/08/17   Alwyn Ren, MD  cephALEXin (KEFLEX) 250 MG  capsule Take 1 capsule (250 mg total) by mouth 4 (four) times daily. 04/30/19   Terrilee Files, MD  famotidine (PEPCID) 20 MG tablet Take 1 tablet by mouth daily. 01/16/19   [provider]  feeding supplement, GLUCERNA SHAKE, (GLUCERNA SHAKE) LIQD Take 237 mLs by mouth 3 (three) times daily between meals. Patient not taking: Reported on 02/04/2019 06/07/18   Richarda Overlie, MD  glipiZIDE (GLUCOTROL XL) 10 MG 24 hr tablet Take 1 tablet (10 mg total) by mouth daily. 11/07/17   Alwyn Ren, MD  HYDROcodone-acetaminophen (NORCO/VICODIN) 5-325 MG tablet Take 1 tablet by mouth every 6 (six) hours as needed for severe pain. 04/30/19   Terrilee Files, MD  ipratropium-albuterol (DUONEB) 0.5-2.5 (3) MG/3ML SOLN Take 3 mLs by nebulization every 2 (two) hours as needed. 11/07/17   Alwyn Ren, MD  polyethylene glycol [facility] Ethelene Hal) packet Take 17 g by mouth daily as needed for mild constipation. 06/07/18   Richarda Overlie, MD  Respiratory Therapy Supplies (FLUTTER) DEVI Use as directed 06/26/18   Chilton Greathouse, MD  rosuvastatin (CRESTOR) 10 MG tablet Take 1 tablet (10 mg total) by mouth daily. 02/04/19 05/05/19  Toniann Fail, NP    ALLERGIES:  Allergies  Allergen Reactions  . Lipitor [Atorvastatin] Other (See Comments)    Muscle pain     SOCIAL  HISTORY:  Social History   Tobacco Use  . Smoking status: Current Some Day Smoker    Packs/day: 0.50    Years: 43.00    Pack years: 21.50    Types: Cigarettes    Last attempt to quit: 02/29/2016    Years since quitting: 3.1  . Smokeless tobacco: Never Used  Substance Use Topics  . Alcohol use: Yes    Comment: occasional    FAMILY HISTORY: Family History  Problem Relation Age of Onset  . COPD Mother   . Seizures Father     EXAM: BP 127/76 (BP Location: Right Arm)   Pulse 77   Temp 97.9 F (36.6 C) (Oral)   Resp 20   SpO2 98%  CONSTITUTIONAL: Patient will arouse to sternal rub and have purposeful movement  and answer some questions intermittently before falling back asleep. HEAD: Normocephalic, atraumatic EYES: Conjunctivae clear, pupils appear equal, EOMI ENT: normal nose; moist mucous membranes NECK: Supple, no meningismus, no nuchal rigidity, no LAD  CARD: RRR; S1 and S2 appreciated; no murmurs, no clicks, no rubs, no gallops RESP: Normal chest excursion without splinting or tachypnea; breath sounds clear and equal bilaterally; no wheezes, no rhonchi, no rales, no hypoxia or respiratory distress, speaking full sentences ABD/GI: Normal bowel sounds; non-distended; soft, non-tender, no rebound, no guarding, no peritoneal signs, no hepatosplenomegaly BACK:  The back appears normal and is non-tender to palpation, there is no CVA tenderness EXT: Normal ROM in all joints; non-tender to palpation; no edema; normal capillary refill; no cyanosis, no calf tenderness or swelling    SKIN: Normal color for age and race; warm; no rash NEURO: Moves all extremities equally and purposefully after painful stimuli, slightly slurred speech, quickly falls back asleep, patient has difficulty following commands, patient appears to have asterixis on exam   MEDICAL DECISION MAKING: Patient here with altered mental status.  Initially called out as a code stroke.  Seen by neurology.  CT shows no acute abnormality.  Differential also includes hypercapnia from respiratory failure, polypharmacy, hepatic encephalopathy, infection.  Labs pending.  Will obtain ABG, chest x-ray, urine.  Also obtain COVID swab.  Patient will be taken emergently to MRI.  No TPA at this time unless MRI shows acute stroke per Dr. Rory Percy.  ED PROGRESS: MRI shows no acute abnormality.  Patient's blood gas appears compensated.  Urine shows no sign of UTI.  Chest x-ray shows pulmonary edema without infiltrate.  Troponin, BNP pending.  Ammonia elevated at 76.  Favor hepatic encephalopathy especially with asterixis on exam.  Patient prescribed amitriptyline.   This may be contributing to her altered mental status as well.  Amitriptyline metabolized primarily by the liver.  Neuro recommends EEG in AM.  Will admit to medicine.  1:16 AM Discussed patient's case with hospitalist, Dr. Jonelle Sidle.  I have recommended admission and patient (and family if present) agree with this plan. Admitting physician will place admission orders.   I reviewed all nursing notes, vitals, pertinent previous records, EKGs, lab and urine results, imaging (as available).     EKG Interpretation  Date/Time:  Saturday May 04 2019 23:15:49 EDT Ventricular Rate:  78 PR Interval:    QRS Duration: 100 QT Interval:  421 QTC Calculation: 480 R Axis:   45 Text Interpretation:  Sinus rhythm No significant change since last tracing Confirmed by Pryor Curia [phone]) on 05/04/2019 11:23:19 PM        CRITICAL CARE Performed by: Cyril Mourning Genna Casimir   Total critical care time: 55 minutes  Critical care time was exclusive of separately billable procedures and treating other patients.  Critical care was necessary to treat or prevent imminent or life-threatening deterioration.  Critical care was time spent personally by me on the following activities: development of treatment plan with patient and/or surrogate as well as nursing, discussions with consultants, evaluation of patient's response to treatment, examination of patient, obtaining history from patient or surrogate, ordering and performing treatments and interventions, ordering and review of laboratory studies, ordering and review of radiographic studies, pulse oximetry and re-evaluation of patient's condition.     Walt Geathers, Layla Maw, DO 05/05/19 0117

## 2019-05-04 NOTE — Code Documentation (Addendum)
Responded to Code Stroke called at 8127 for slurred speech. Pt took nighttime meds at 2030. At around 2100, pt's dtr noted slurred speech. Per EMS, on their arrival, pt alert and oriented, talking(slurred), however declined en route to ED, requiring sternal rub. 0.5mg  narcan given x 2 with no change in mentation.  Pt arrived to [facility] ED at 2247, NIH-8, CBG-263, [id number], CT head negative for acute findings. CTA-negative LVO. Plan: MRI.

## 2019-05-04 NOTE — ED Triage Notes (Addendum)
Pt BIB [facility] for eval of ? Stroke like symptoms. Per EMS pt took her nighttime meds at 2030 daughter reports LKN @2100 . Noted slurred speech @ approx 2115 and activated EMS. Daughter reports that "sometimes slurred speech happens" w/ her nighttime meds and that "she wasn't behaving like herself. Daughter reported speech more slurred than normal. EMS reports pt was initially A&Ox4, w/ +orthostatic changes, but conversive with them. En route to ED, pt became increasingly lethargic progressing to near unresponsiveness. EMS noted pinpoint pupils, but stated no change in respiratory status. Given pupils, EMS admin 0.5mg  Narcan x 2 w/ no change. Pt arrives obtunded, responsive to sternal rub. No focal neuro deficits noted by EMS or ED team. Pt w/ slurred speech, however no unilateral weakness/deficits. Generalized weakness.

## 2019-05-05 ENCOUNTER — Inpatient Hospital Stay (HOSPITAL_BASED_OUTPATIENT_CLINIC_OR_DEPARTMENT_OTHER): Payer: Medicare Other

## 2019-05-05 ENCOUNTER — Inpatient Hospital Stay (HOSPITAL_COMMUNITY): Payer: Medicare Other

## 2019-05-05 ENCOUNTER — Emergency Department (HOSPITAL_COMMUNITY): Payer: Medicare Other

## 2019-05-05 DIAGNOSIS — I5032 Chronic diastolic (congestive) heart failure: Secondary | ICD-10-CM | POA: Diagnosis not present

## 2019-05-05 DIAGNOSIS — R609 Edema, unspecified: Secondary | ICD-10-CM

## 2019-05-05 DIAGNOSIS — J9611 Chronic respiratory failure with hypoxia: Secondary | ICD-10-CM

## 2019-05-05 DIAGNOSIS — G9341 Metabolic encephalopathy: Secondary | ICD-10-CM | POA: Diagnosis not present

## 2019-05-05 DIAGNOSIS — E876 Hypokalemia: Secondary | ICD-10-CM | POA: Diagnosis present

## 2019-05-05 DIAGNOSIS — E1151 Type 2 diabetes mellitus with diabetic peripheral angiopathy without gangrene: Secondary | ICD-10-CM | POA: Diagnosis not present

## 2019-05-05 DIAGNOSIS — G92 Toxic encephalopathy: Secondary | ICD-10-CM | POA: Diagnosis not present

## 2019-05-05 DIAGNOSIS — K219 Gastro-esophageal reflux disease without esophagitis: Secondary | ICD-10-CM

## 2019-05-05 DIAGNOSIS — I10 Essential (primary) hypertension: Secondary | ICD-10-CM

## 2019-05-05 DIAGNOSIS — R4182 Altered mental status, unspecified: Secondary | ICD-10-CM | POA: Diagnosis not present

## 2019-05-05 LAB — COMPREHENSIVE METABOLIC PANEL
ALT: 15 U/L (ref 0–44)
AST: 15 U/L (ref 15–41)
Albumin: 2.6 g/dL — ABNORMAL LOW (ref 3.5–5.0)
Alkaline Phosphatase: 152 U/L — ABNORMAL HIGH (ref 38–126)
Anion gap: 11 (ref 5–15)
BUN: 6 mg/dL — ABNORMAL LOW (ref 8–23)
CO2: 37 mmol/L — ABNORMAL HIGH (ref 22–32)
Calcium: 8.1 mg/dL — ABNORMAL LOW (ref 8.9–10.3)
Chloride: 94 mmol/L — ABNORMAL LOW (ref 98–111)
Creatinine, Ser: 0.87 mg/dL (ref 0.44–1.00)
GFR calc Af Amer: >60 mL/min (ref >60)
GFR calc non Af Amer: >60 mL/min (ref >60)
Glucose, Bld: 70 mg/dL (ref 70–99)
Potassium: 3.9 mmol/L (ref 3.5–5.1)
Sodium: 142 mmol/L (ref 135–145)
Total Bilirubin: 0.9 mg/dL (ref 0.3–1.2)
Total Protein: 6.5 g/dL (ref 6.5–8.1)

## 2019-05-05 LAB — URINALYSIS, ROUTINE W REFLEX MICROSCOPIC
Bilirubin Urine: NEGATIVE
Glucose, UA: NEGATIVE mg/dL
Hgb urine dipstick: NEGATIVE
Ketones, ur: NEGATIVE mg/dL
Leukocytes,Ua: NEGATIVE
Nitrite: NEGATIVE
Protein, ur: NEGATIVE mg/dL
Specific Gravity, Urine: 1.044 — ABNORMAL HIGH (ref 1.005–1.030)
pH: 6 (ref 5.0–8.0)

## 2019-05-05 LAB — CBC
HCT: 36.4 % (ref 36.0–46.0)
Hemoglobin: 11.4 g/dL — ABNORMAL LOW (ref 12.0–15.0)
MCH: 21.7 pg — ABNORMAL LOW (ref 26.0–34.0)
MCHC: 31.3 g/dL (ref 30.0–36.0)
MCV: 69.2 fL — ABNORMAL LOW (ref 80.0–100.0)
Platelets: 322 10*3/uL (ref 150–400)
RBC: 5.26 MIL/uL — ABNORMAL HIGH (ref 3.87–5.11)
RDW: 21.9 % — ABNORMAL HIGH (ref 11.5–15.5)
WBC: 5.7 10*3/uL (ref 4.0–10.5)
nRBC: 0 % (ref 0.0–0.2)

## 2019-05-05 LAB — TSH: TSH: 1.454 u[IU]/mL (ref 0.350–4.500)

## 2019-05-05 LAB — SARS CORONAVIRUS 2 BY RT PCR (HOSPITAL ORDER, PERFORMED IN ~~LOC~~ HOSPITAL LAB): SARS Coronavirus 2: NEGATIVE

## 2019-05-05 LAB — RAPID URINE DRUG SCREEN, HOSP PERFORMED
Amphetamines: NOT DETECTED
Barbiturates: NOT DETECTED
Benzodiazepines: NOT DETECTED
Cocaine: NOT DETECTED
Opiates: NOT DETECTED
Tetrahydrocannabinol: NOT DETECTED

## 2019-05-05 LAB — GLUCOSE, CAPILLARY
Glucose-Capillary: 151 mg/dL — ABNORMAL HIGH (ref 70–99)
Glucose-Capillary: 105 mg/dL — ABNORMAL HIGH (ref 70–99)
Glucose-Capillary: 46 mg/dL — ABNORMAL LOW (ref 70–99)
Glucose-Capillary: 207 mg/dL — ABNORMAL HIGH (ref 70–99)
Glucose-Capillary: 224 mg/dL — ABNORMAL HIGH (ref 70–99)
Glucose-Capillary: 173 mg/dL — ABNORMAL HIGH (ref 70–99)

## 2019-05-05 LAB — ETHANOL: Alcohol, Ethyl (B): 57 mg/dL — ABNORMAL HIGH (ref <10)

## 2019-05-05 LAB — BRAIN NATRIURETIC PEPTIDE: B Natriuretic Peptide: 562.5 pg/mL — ABNORMAL HIGH (ref 0.0–100.0)

## 2019-05-05 LAB — AMMONIA: Ammonia: 76 umol/L — ABNORMAL HIGH (ref 9–35)

## 2019-05-05 LAB — TROPONIN I: Troponin I: <0.03 ng/mL (ref <0.03)

## 2019-05-05 LAB — HIV ANTIBODY (ROUTINE TESTING W REFLEX): HIV Screen 4th Generation wRfx: NONREACTIVE

## 2019-05-05 MED ORDER — FUROSEMIDE 10 MG/ML IJ SOLN
40.0000 mg | Freq: Once | INTRAMUSCULAR | Status: AC
  Administered 2019-05-05: 40 mg via INTRAVENOUS
  Filled 2019-05-05: qty 4

## 2019-05-05 MED ORDER — TIZANIDINE HCL 4 MG PO TABS
4.0000 mg | ORAL_TABLET | Freq: Four times a day (QID) | ORAL | Status: DC | PRN
  Administered 2019-05-05: 4 mg via ORAL
  Filled 2019-05-05: qty 1

## 2019-05-05 MED ORDER — ENSURE ENLIVE PO LIQD
237.0000 mL | Freq: Two times a day (BID) | ORAL | Status: DC
  Administered 2019-05-05 – 2019-05-06 (×2): 237 mL via ORAL

## 2019-05-05 MED ORDER — INSULIN ASPART 100 UNIT/ML ~~LOC~~ SOLN: 0.0000 [IU] | Freq: Every day | SUBCUTANEOUS | Status: DC

## 2019-05-05 MED ORDER — ENOXAPARIN SODIUM 40 MG/0.4ML ~~LOC~~ SOLN
40.0000 mg | Freq: Every day | SUBCUTANEOUS | Status: DC
  Administered 2019-05-05 – 2019-05-06 (×2): 40 mg via SUBCUTANEOUS
  Filled 2019-05-05 (×2): qty 0.4

## 2019-05-05 MED ORDER — LACTULOSE ENEMA
300.0000 mL | Freq: Two times a day (BID) | ORAL | Status: DC
  Filled 2019-05-05 (×2): qty 300

## 2019-05-05 MED ORDER — AMITRIPTYLINE HCL 25 MG PO TABS
50.0000 mg | ORAL_TABLET | Freq: Every day | ORAL | Status: DC
  Administered 2019-05-05: 50 mg via ORAL
  Filled 2019-05-05: qty 2

## 2019-05-05 MED ORDER — NICOTINE 21 MG/24HR TD PT24
21.0000 mg | MEDICATED_PATCH | Freq: Every day | TRANSDERMAL | Status: DC
  Administered 2019-05-05 – 2019-05-06 (×2): 21 mg via TRANSDERMAL
  Filled 2019-05-05 (×2): qty 1

## 2019-05-05 MED ORDER — DEXTROSE 50 % IV SOLN
INTRAVENOUS | Status: AC
  Administered 2019-05-05: 50 mL
  Filled 2019-05-05: qty 50

## 2019-05-05 MED ORDER — ONDANSETRON HCL 4 MG/2ML IJ SOLN: 4.0000 mg | Freq: Four times a day (QID) | INTRAMUSCULAR | Status: DC | PRN

## 2019-05-05 MED ORDER — POTASSIUM CHLORIDE 10 MEQ/100ML IV SOLN
10.0000 meq | INTRAVENOUS | Status: AC
  Administered 2019-05-05 (×4): 10 meq via INTRAVENOUS
  Filled 2019-05-05 (×4): qty 100

## 2019-05-05 MED ORDER — ONDANSETRON HCL 4 MG PO TABS: 4.0000 mg | ORAL_TABLET | Freq: Four times a day (QID) | ORAL | Status: DC | PRN

## 2019-05-05 MED ORDER — INSULIN ASPART 100 UNIT/ML ~~LOC~~ SOLN
0.0000 [IU] | Freq: Three times a day (TID) | SUBCUTANEOUS | Status: DC
  Administered 2019-05-05 – 2019-05-06 (×2): 3 [IU] via SUBCUTANEOUS

## 2019-05-05 MED ORDER — LACTULOSE 10 GM/15ML PO SOLN
10.0000 g | Freq: Two times a day (BID) | ORAL | Status: DC
  Filled 2019-05-05: qty 15

## 2019-05-05 MED ORDER — HYDRALAZINE HCL 20 MG/ML IJ SOLN
10.0000 mg | Freq: Four times a day (QID) | INTRAMUSCULAR | Status: DC | PRN
  Administered 2019-05-05: 10 mg via INTRAVENOUS
  Filled 2019-05-05: qty 1

## 2019-05-05 NOTE — Progress Notes (Addendum)
Acknowledged SIRS advisory warning, HR was only criteria that was elevated, all other vitals within normal limits RRT RN aware  Messaged MD to inform as well Will continue to monitor

## 2019-05-05 NOTE — Progress Notes (Signed)
Neuro MD called regarding additional doppler assessment be completed for lower extremity pulses at bedside and inform primary with results  RN completed, pedal pulses audible, and informed primary MD per request

## 2019-05-05 NOTE — Progress Notes (Signed)
Reason for consult: Encephalopathy  Subjective: She has comfortably sitting in chair and having her dinner.    Was alert and oriented x3 and seemed to understand that her ammonia was high and is hoping she does not have cirrhosis.   ROS: negative except above  Examination  Vital signs in last 24 hours: Temp:  [97.3 F (36.3 C)-98.4 F (36.9 C)] 98.4 F (36.9 C) (06/07 1248) Pulse Rate:  [73-106] 106 (06/07 1538) Resp:  [15-20] 17 (06/07 1538) BP: (125-157)/(67-76) 140/75 (06/07 1538) SpO2:  [98 %-100 %] 98 % (06/07 1538) Weight:  [54.6 kg] 54.6 kg (06/07 0356)  General: Sitting in chair doubly CVS: pulse-normal rate and rhythm RS: breathing comfortably Extremities: normal   Neuro: MS: Alert, oriented, follows commands CN: pupils equal and reactive,  EOMI, face symmetric, tongue midline, normal sensation over face, Motor: 5/5 strength in all 4 extremities Coordination: normal Gait: not tested  Basic Metabolic Panel: Recent Labs  Lab 04/30/19 0942 05/04/19 2255 05/04/19 2256 05/04/19 2340 05/05/19 1046  NA 140 133* 130* 133* 142  K 3.9 3.3* 3.2* 2.8* 3.9  CL 97* 89* 91*  --  94*  CO2 35*  --  32  --  37*  GLUCOSE 116* 274* 286*  --  70  BUN 9 9 8   --  6*  CREATININE 0.89 1.10* 0.99  --  0.87  CALCIUM 8.0*  --  7.5*  --  8.1*    CBC: Recent Labs  Lab 04/30/19 0942 05/04/19 2255 05/04/19 2256 05/04/19 2340 05/05/19 0518  WBC 6.0  --  6.3  --  5.7  NEUTROABS 3.6  --  3.2  --   --   HGB 11.3* 12.2 9.9* 11.6* 11.4*  HCT 37.8 36.0 31.9* 34.0* 36.4  MCV 71.7*  --  70.3*  --  69.2*  PLT 350  --  396  --  322     Coagulation Studies: Recent Labs    05/04/19 2256  LABPROT 14.9  INR 1.2    Imaging Reviewed:     ASSESSMENT AND PLAN Neuro female with pulmonary fibrosis, CHF cocaine abuse, hepatitis C, diabetes, rheumatoid arthritis presents as a code stroke for reduced level of consciousness and slurred speech.  MRI brain negative for acute stroke.   EEG was performed which shows no evidence for seizures.  Ammonia was noted to be elevated.  Today patient is alert and oriented.   Metabolic Encephalopathy-likely hepatic encephalopathy  EEG negative for seizures Continue lactulose and correct hyperammonemia   B/L Leg pain - venous doppler negative - asked nurse to check arterial dopplers    Neurology will be available as needed.  Thanks for the consult  Karena Addison Aroor [facility] Pager Number [phone] For questions after 7pm please refer to AMION to reach the Neurologist on call
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
## 2019-05-05 NOTE — Progress Notes (Signed)
Pt daughter called, update provided  Pt daughter concerned for pt confusion and fall risk  Pt daughter, Curly [place], has been approved by CN to sit at bedside with pt

## 2019-05-05 NOTE — Progress Notes (Signed)
Patient admitted from the ED. Patient is alert and oriented x2. Vital signs are stable. Iv in place and running fluid. Skin assessment done with another nurse. Patient given instruction about call bell and phone. Bed in low position and side rail up x2.

## 2019-05-05 NOTE — Progress Notes (Signed)
VASCULAR LAB PRELIMINARY  PRELIMINARY  PRELIMINARY  PRELIMINARY  Bilateral lower extremity venous duplex completed.    Preliminary report:  See CV Proc for preliminary results.   Giovanni Biby, RVT 05/05/2019, 5:51 PM
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
## 2019-05-05 NOTE — Progress Notes (Signed)
Pt CBG 46, pt alert and oriented, responding appropriately  Dextrose given IV  Will recheck CBG in 15 minutes  RN at bedside

## 2019-05-05 NOTE — Progress Notes (Signed)
EEG Completed; Results Pending  

## 2019-05-05 NOTE — Procedures (Signed)
ELECTROENCEPHALOGRAM REPORT   Patient: Alexandria Coffey       Room #: 6R67E EEG No. ID: [id number] Age: 68 y.o.        Sex: female Referring Physician: Starla Link Report Date:  05/05/2019        Interpreting Physician: Alexis Goodell  History: Alexandria Coffey is an 67 y.o. female with altered mental status  Medications:  Insulin, Lactulose  Conditions of Recording:  This is a 21 channel routine scalp EEG performed with bipolar and monopolar montages arranged in accordance to the international 10/20 system of electrode placement. One channel was dedicated to EKG recording.  The patient is in the awake and drowsy states.  Description:  The waking background activity consists of a low voltage, symmetrical, fairly well organized, 8 Hz alpha activity, seen from the parieto-occipital and posterior temporal regions.  Low voltage fast activity, poorly organized, is seen anteriorly and is at times superimposed on more posterior regions.  A mixture of theta and alpha rhythms are seen from the central and temporal regions. The patient drowses with slowing to irregular, low voltage theta and beta activity.   Stage II sleep is not obtained. No epileptiform activity is noted.   Hyperventilation was not performed.  Intermittent photic stimulation was performed but failed to illicit any change in the tracing.     IMPRESSION: Normal electroencephalogram, awake, drowsy and with activation procedures. There are no focal lateralizing or epileptiform features.   Alexis Goodell, MD Neurology [phone] 05/05/2019, 3:31 PM

## 2019-05-05 NOTE — ED Notes (Signed)
Report given to 5W RN. All questions answered 

## 2019-05-05 NOTE — Progress Notes (Signed)
Patient ID: Alexandria Coffey, female   DOB: June 07, 1951, 68 y.o.   MRN: [id number] Patient was admitted early this morning for altered mental status with question of hepatic encephalopathy.  Patient seen and examined at bedside.  She is currently alert awake oriented x3 and states that she is hungry.  Plan of care discussed with her.  I have reviewed patient's medical records including this morning's H&P myself.  I have reviewed and made changes to her current medications.  I will change her lactulose enema to oral lactulose.  Neurology is following.  EEG is pending.  She has bilateral lower extremity swelling.  Will order lower extremity duplex ultrasound.  We will start her on diet.  PT eval.  Repeat a.m. labs.  Probable discharge in a.m. if patient remains stable.

## 2019-05-05 NOTE — Progress Notes (Signed)
MD,  Deetta Perla, the patient's daughter, has requested an update after am rounds.  If the patient becomes more confused and more of a safety concern, the daughter has inquired if she may be granted permission to visit in order to aide her mother in reorientation, as this has helped her in the past.  Please call her at [phone].  Thanks.

## 2019-05-06 DIAGNOSIS — E1151 Type 2 diabetes mellitus with diabetic peripheral angiopathy without gangrene: Secondary | ICD-10-CM | POA: Diagnosis not present

## 2019-05-06 DIAGNOSIS — J9611 Chronic respiratory failure with hypoxia: Secondary | ICD-10-CM | POA: Diagnosis not present

## 2019-05-06 DIAGNOSIS — R4182 Altered mental status, unspecified: Secondary | ICD-10-CM | POA: Diagnosis not present

## 2019-05-06 LAB — CBC WITH DIFFERENTIAL/PLATELET
Abs Immature Granulocytes: 0.03 10*3/uL (ref 0.00–0.07)
Basophils Absolute: 0 10*3/uL (ref 0.0–0.1)
Basophils Relative: 0 %
Eosinophils Absolute: 0 10*3/uL (ref 0.0–0.5)
Eosinophils Relative: 0 %
HCT: 37.1 % (ref 36.0–46.0)
Hemoglobin: 11.6 g/dL — ABNORMAL LOW (ref 12.0–15.0)
Immature Granulocytes: 0 %
Lymphocytes Relative: 21 %
Lymphs Abs: 1.5 10*3/uL (ref 0.7–4.0)
MCH: 21.7 pg — ABNORMAL LOW (ref 26.0–34.0)
MCHC: 31.3 g/dL (ref 30.0–36.0)
MCV: 69.5 fL — ABNORMAL LOW (ref 80.0–100.0)
Monocytes Absolute: 0.7 10*3/uL (ref 0.1–1.0)
Monocytes Relative: 10 %
Neutro Abs: 4.9 10*3/uL (ref 1.7–7.7)
Neutrophils Relative %: 69 %
Platelets: 309 10*3/uL (ref 150–400)
RBC: 5.34 MIL/uL — ABNORMAL HIGH (ref 3.87–5.11)
RDW: 22.1 % — ABNORMAL HIGH (ref 11.5–15.5)
WBC: 7.1 10*3/uL (ref 4.0–10.5)
nRBC: 0 % (ref 0.0–0.2)

## 2019-05-06 LAB — COMPREHENSIVE METABOLIC PANEL
ALT: 13 U/L (ref 0–44)
AST: 15 U/L (ref 15–41)
Albumin: 2.5 g/dL — ABNORMAL LOW (ref 3.5–5.0)
Alkaline Phosphatase: 169 U/L — ABNORMAL HIGH (ref 38–126)
Anion gap: 9 (ref 5–15)
BUN: 11 mg/dL (ref 8–23)
CO2: 37 mmol/L — ABNORMAL HIGH (ref 22–32)
Calcium: 8.4 mg/dL — ABNORMAL LOW (ref 8.9–10.3)
Chloride: 94 mmol/L — ABNORMAL LOW (ref 98–111)
Creatinine, Ser: 0.97 mg/dL (ref 0.44–1.00)
GFR calc Af Amer: >60 mL/min (ref >60)
GFR calc non Af Amer: >60 mL/min (ref >60)
Glucose, Bld: 195 mg/dL — ABNORMAL HIGH (ref 70–99)
Potassium: 4.1 mmol/L (ref 3.5–5.1)
Sodium: 140 mmol/L (ref 135–145)
Total Bilirubin: 0.7 mg/dL (ref 0.3–1.2)
Total Protein: 6.4 g/dL — ABNORMAL LOW (ref 6.5–8.1)

## 2019-05-06 LAB — MAGNESIUM: Magnesium: 1.3 mg/dL — ABNORMAL LOW (ref 1.7–2.4)

## 2019-05-06 LAB — GLUCOSE, CAPILLARY
Glucose-Capillary: 125 mg/dL — ABNORMAL HIGH (ref 70–99)
Glucose-Capillary: 206 mg/dL — ABNORMAL HIGH (ref 70–99)

## 2019-05-06 LAB — HEMOGLOBIN A1C
Hgb A1c MFr Bld: 8.5 % — ABNORMAL HIGH (ref 4.8–5.6)
Mean Plasma Glucose: 197.25 mg/dL

## 2019-05-06 LAB — VITAMIN B12: Vitamin B-12: 339 pg/mL (ref 180–914)

## 2019-05-06 LAB — AMMONIA: Ammonia: 27 umol/L (ref 9–35)

## 2019-05-06 LAB — TSH: TSH: 1.551 u[IU]/mL (ref 0.350–4.500)

## 2019-05-06 LAB — FOLATE: Folate: 5.7 ng/mL — ABNORMAL LOW (ref >5.9)

## 2019-05-06 MED ORDER — LACTULOSE 10 GM/15ML PO SOLN: 10.0000 g | Freq: Every day | ORAL | 0 refills | Status: AC

## 2019-05-06 MED ORDER — MAGNESIUM SULFATE 2 GM/50ML IV SOLN
2.0000 g | Freq: Once | INTRAVENOUS | Status: AC
  Administered 2019-05-06: 2 g via INTRAVENOUS
  Filled 2019-05-06: qty 50

## 2019-05-06 MED ORDER — FOLIC ACID 1 MG PO TABS: 1.0000 mg | ORAL_TABLET | Freq: Every day | ORAL | 0 refills | Status: AC

## 2019-05-06 MED ORDER — FOLIC ACID 1 MG PO TABS
1.0000 mg | ORAL_TABLET | Freq: Every day | ORAL | Status: DC
  Administered 2019-05-06: 1 mg via ORAL
  Filled 2019-05-06: qty 1

## 2019-05-06 NOTE — Evaluation (Signed)
Physical Therapy Evaluation Patient Details Name: Alexandria Coffey MRN: [id number] DOB: 19-Mar-1951 Today's Date: 05/06/2019   History of Present Illness  Alexandria Coffey is a 68 y.o. female with medical history significant of pulmonary fibrosis on chronic oxygen therapy, anxiety disorder, polysubstance abuse including cocaine and tobacco, diastolic CHF, depression, hepatitis C, diabetes, rheumatoid arthritis who was brought in secondary to altered mental status at home.  Clinical Impression   Pt admitted with above diagnosis. Pt currently with functional limitations due to the deficits listed below (see PT Problem List). Was managing independently, using RW prn prior to admission; Presensts with decr functional capacity, need for supplemental O2 with activity; Reports difficulty managing with her O2 tank at home, and that sometimes she doesn't use her O2 due to the cumbersomeness of the tank; Pt will benefit from skilled PT to increase their independence and safety with mobility to allow discharge to the venue listed below.       Follow Up Recommendations Supervision - Intermittent;Other (comment)(HHRN for chronic disease management)    Equipment Recommendations  Other (comment)(Rollator RW; Does Alexandria Coffey have an option for a smaller, more manageable O2 tank for home?)    Recommendations for Other Services OT consult     Precautions / Restrictions Precautions Precautions: Fall Precaution Comments: Fall risk significantly reduced with use of assistive device      Mobility  Bed Mobility Overal bed mobility: Needs Assistance Bed Mobility: Supine to Sit     Supine to sit: Supervision     General bed mobility comments: Supervision for safety  Transfers Overall transfer level: Needs assistance Equipment used: Rolling walker (2 wheeled) Transfers: Sit to/from Stand Sit to Stand: Min guard(without physical assist)         General transfer comment: No gross difficulty; noted dependence  on UEs to steady  Ambulation/Gait Ambulation/Gait assistance: Min guard Gait Distance (Feet): 100 Feet Assistive device: Rolling walker (2 wheeled) Gait Pattern/deviations: Step-through pattern;Decreased step length - right;Decreased step length - left;Decreased stride length     General Gait Details: Cues to self-monitor for activity tolerance  Stairs            Wheelchair Mobility    Modified Rankin (Stroke Patients Only)       Balance                                             Pertinent Vitals/Pain Pain Assessment: Faces Faces Pain Scale: Hurts a little bit Pain Location: dorsum of R foot and ankle Pain Descriptors / Indicators: Aching Pain Intervention(s): Monitored during session    Home Living Family/patient expects to be discharged to:: Private residence Living Arrangements: Children Available Help at Discharge: Family;Available PRN/intermittently Type of Home: House Home Access: Stairs to enter Entrance Stairs-Rails: None Entrance Stairs-Number of Steps: 2 Home Layout: One level Home Equipment: Walker - 2 wheels;Shower seat(HOme O2) Additional Comments: Daughter works during day    Prior Function Level of Independence: Independent;Independent with assistive device(s)         Comments: Drives; RW prn     Hand Dominance   Dominant Hand: Right    Extremity/Trunk Assessment   Upper Extremity Assessment Upper Extremity Assessment: Overall WFL for tasks assessed    Lower Extremity Assessment Lower Extremity Assessment: Generalized weakness(though mild)       Communication   Communication: No difficulties  Cognition Arousal/Alertness: Awake/alert  Behavior During Therapy: WFL for tasks assessed/performed Overall Cognitive Status: Within Functional Limits for tasks assessed(for simple mobility tasks)                                        General Comments General comments (skin integrity, edema, etc.):  Walked on 2L supplemental O2, and O2 sats ranged 88-92%    Exercises     Assessment/Plan    PT Assessment Patient needs continued PT services  PT Problem List Decreased strength;Decreased activity tolerance;Decreased balance;Decreased mobility;Decreased knowledge of use of DME;Decreased knowledge of precautions;Cardiopulmonary status limiting activity       PT Treatment Interventions DME instruction;Gait training;Stair training;Functional mobility training;Therapeutic activities;Therapeutic exercise;Balance training;Patient/family education    PT Goals (Current goals can be found in the Care Plan section)  Acute Rehab PT Goals Patient Stated Goal: agreeable to amb PT Goal Formulation: With patient Time For Goal Achievement: 05/20/19 Potential to Achieve Goals: Good    Frequency Min 3X/week   Barriers to discharge   Daughter works during the day; must be modified independent with household mobility and ADLs    Co-evaluation               AM-PAC PT "6 Clicks" Mobility  Outcome Measure Help needed turning from your back to your side while in a flat bed without using bedrails?: None Help needed moving from lying on your back to sitting on the side of a flat bed without using bedrails?: None Help needed moving to and from a bed to a chair (including a wheelchair)?: None Help needed standing up from a chair using your arms (e.g., wheelchair or bedside chair)?: A Little Help needed to walk in hospital room?: A Little Help needed climbing 3-5 steps with a railing? : A Little 6 Click Score: 21    End of Session Equipment Utilized During Treatment: Gait belt;Oxygen Activity Tolerance: Patient tolerated treatment well Patient left: in chair;with call bell/phone within reach;with chair alarm set Nurse Communication: Mobility status PT Visit Diagnosis: Unsteadiness on feet (R26.81);Other abnormalities of gait and mobility (R26.89);Other (comment)(decr functional capacity)     Time: [phone] PT Time Calculation (min) (ACUTE ONLY): 24 min   Charges:   PT Evaluation $PT Eval Low Complexity: 1 Low PT Treatments $Gait Training: 8-22 mins        Van Clines, PT  Acute Rehabilitation Services Pager [phone] Office [phone]   Levi Aland 05/06/2019, 9:39 AM
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
## 2019-05-06 NOTE — Progress Notes (Signed)
Discharge instructions reviewed with pt.  Copy of instructions given to pt and informed new scripts at her pharmacy, as noted in d/c instructions.  Pt's daughter will be picking her up. Pt waiting for her arrival.   At 58   Pt's daughter has arrived at entrance for pt pickup. Pt to be d/c'd via wheelchair with belongings.          Escorted by unit NT.

## 2019-05-06 NOTE — Care Management Obs Status (Signed)
[facility] NOTIFICATION   Patient Details  Name: Alexandria Coffey MRN: [id number] Date of Birth: 21-Jun-1951   Medicare Observation Status Notification Given:  Yes I CM, Elenor Quinones RN CM, spoke with Melinda Crutch and explained form to patient.  Pt is agreeable to verbal signature.  I have instructed CSW assigned to pts room to deliver to pt at bedside.     Maryclare Labrador, RN 05/06/2019, 9:50 AM

## 2019-05-06 NOTE — Care Management CC44 (Signed)
Condition Code 44 Documentation Completed  Patient Details  Name: Alexandria Coffey MRN: [id number] Date of Birth: 1951-01-25   Condition Code 44 given:  Yes Patient signature on Condition Code 44 notice:  (CM reviewed Code 44 via phone, CSW delivered document) Documentation of 2 MD's agreement:  Yes Code 44 added to claim:  Yes  I CM, Elenor Quinones RN CM, spoke with Melinda Crutch and explained form to patient.  Pt is agreeable to verbal signature.  I have instructed CSW assigned to pts room to deliver to pt at bedside.   Maryclare Labrador, RN 05/06/2019, 9:55 AM

## 2019-05-07 ENCOUNTER — Ambulatory Visit: Payer: Medicare Other | Admitting: Cardiology
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
## 2019-05-17 ENCOUNTER — Ambulatory Visit: Payer: Medicare Other | Admitting: Cardiology

## 2019-05-21 ENCOUNTER — Other Ambulatory Visit: Payer: Medicare Other

## 2019-05-21 ENCOUNTER — Ambulatory Visit: Payer: Medicare Other

## 2019-05-29 DEATH — deceased

## 2019-05-30 ENCOUNTER — Telehealth: Payer: Self-pay | Admitting: Pulmonary Disease

## 2019-05-30 NOTE — Telephone Encounter (Signed)
Noted. Will route this message to PM to make aware. Nothing further is needed at this time.

## 2019-06-03 ENCOUNTER — Inpatient Hospital Stay: Admission: RE | Admit: 2019-06-03 | Payer: Medicare Other | Source: Ambulatory Visit

## 2019-07-01 ENCOUNTER — Other Ambulatory Visit: Payer: Medicare Other
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
